# Patient Record
Sex: Male | Born: 1975 | Race: Black or African American | Hispanic: No | Marital: Single | Smoking: Current every day smoker
Health system: Southern US, Community
[De-identification: ages and names within clinical notes are randomized; demographics above are authoritative.]

---

## 2009-12-31 ENCOUNTER — Emergency Department (HOSPITAL_COMMUNITY): Admission: EM | Admit: 2009-12-31 | Discharge: 2009-12-31 | Payer: Self-pay | Admitting: Emergency Medicine

## 2010-02-28 ENCOUNTER — Emergency Department (HOSPITAL_COMMUNITY): Admission: EM | Admit: 2010-02-28 | Discharge: 2010-02-28 | Payer: Self-pay | Admitting: Emergency Medicine

## 2010-05-05 ENCOUNTER — Emergency Department (HOSPITAL_COMMUNITY): Admission: EM | Admit: 2010-05-05 | Discharge: 2010-05-05 | Payer: Self-pay | Admitting: Emergency Medicine

## 2011-01-24 LAB — DIFFERENTIAL
Eosinophils Absolute: 0.2 10*3/uL (ref 0.0–0.7)
Eosinophils Relative: 3 % (ref 0–5)
Lymphs Abs: 1.2 10*3/uL (ref 0.7–4.0)
Monocytes Relative: 12 % (ref 3–12)
Neutro Abs: 5 10*3/uL (ref 1.7–7.7)

## 2011-01-24 LAB — URINALYSIS, ROUTINE W REFLEX MICROSCOPIC
Bilirubin Urine: NEGATIVE
Hgb urine dipstick: NEGATIVE
Protein, ur: NEGATIVE mg/dL
Specific Gravity, Urine: 1.028 (ref 1.005–1.030)
Urobilinogen, UA: 1 mg/dL (ref 0.0–1.0)
pH: 6.5 (ref 5.0–8.0)

## 2011-01-24 LAB — CBC
MCHC: 32.8 g/dL (ref 30.0–36.0)
MCV: 83.4 fL (ref 78.0–100.0)
Platelets: 233 10*3/uL (ref 150–400)
RDW: 14.3 % (ref 11.5–15.5)

## 2011-02-13 ENCOUNTER — Emergency Department (HOSPITAL_COMMUNITY)
Admission: EM | Admit: 2011-02-13 | Discharge: 2011-02-13 | Disposition: A | Discharge status: Home / Self Care | Payer: Self-pay | Attending: Emergency Medicine | Admitting: Emergency Medicine

## 2011-02-13 DIAGNOSIS — J45909 Unspecified asthma, uncomplicated: Secondary | ICD-10-CM | POA: Insufficient documentation

## 2011-02-13 DIAGNOSIS — R0602 Shortness of breath: Secondary | ICD-10-CM | POA: Insufficient documentation

## 2011-03-03 ENCOUNTER — Emergency Department (HOSPITAL_COMMUNITY)
Admission: EM | Admit: 2011-03-03 | Discharge: 2011-03-03 | Disposition: A | Discharge status: Home / Self Care | Payer: Self-pay | Attending: Emergency Medicine | Admitting: Emergency Medicine

## 2011-03-03 ENCOUNTER — Emergency Department (HOSPITAL_COMMUNITY): Payer: Self-pay

## 2011-03-03 DIAGNOSIS — R059 Cough, unspecified: Secondary | ICD-10-CM | POA: Insufficient documentation

## 2011-03-03 DIAGNOSIS — R05 Cough: Secondary | ICD-10-CM | POA: Insufficient documentation

## 2011-03-03 DIAGNOSIS — R0789 Other chest pain: Secondary | ICD-10-CM | POA: Insufficient documentation

## 2011-03-03 DIAGNOSIS — R0602 Shortness of breath: Secondary | ICD-10-CM | POA: Insufficient documentation

## 2011-03-03 DIAGNOSIS — J45909 Unspecified asthma, uncomplicated: Secondary | ICD-10-CM | POA: Insufficient documentation

## 2011-03-19 ENCOUNTER — Emergency Department (HOSPITAL_COMMUNITY)
Admission: EM | Admit: 2011-03-19 | Discharge: 2011-03-19 | Disposition: A | Discharge status: Home / Self Care | Payer: Self-pay | Attending: Emergency Medicine | Admitting: Emergency Medicine

## 2011-03-19 DIAGNOSIS — J45901 Unspecified asthma with (acute) exacerbation: Secondary | ICD-10-CM | POA: Insufficient documentation

## 2011-03-26 ENCOUNTER — Emergency Department (HOSPITAL_COMMUNITY)
Admission: EM | Admit: 2011-03-26 | Discharge: 2011-03-26 | Disposition: A | Discharge status: Home / Self Care | Payer: Self-pay | Attending: Emergency Medicine | Admitting: Emergency Medicine

## 2011-03-26 DIAGNOSIS — J45909 Unspecified asthma, uncomplicated: Secondary | ICD-10-CM | POA: Insufficient documentation

## 2011-03-26 DIAGNOSIS — Z79899 Other long term (current) drug therapy: Secondary | ICD-10-CM | POA: Insufficient documentation

## 2011-04-01 ENCOUNTER — Emergency Department (HOSPITAL_COMMUNITY)
Admission: EM | Admit: 2011-04-01 | Discharge: 2011-04-01 | Disposition: A | Discharge status: Home / Self Care | Payer: Self-pay | Attending: Emergency Medicine | Admitting: Emergency Medicine

## 2011-04-01 ENCOUNTER — Emergency Department (HOSPITAL_COMMUNITY): Payer: Self-pay

## 2011-04-01 DIAGNOSIS — J45909 Unspecified asthma, uncomplicated: Secondary | ICD-10-CM | POA: Insufficient documentation

## 2011-04-01 DIAGNOSIS — R0602 Shortness of breath: Secondary | ICD-10-CM | POA: Insufficient documentation

## 2011-04-06 ENCOUNTER — Emergency Department (HOSPITAL_COMMUNITY)
Admission: EM | Admit: 2011-04-06 | Discharge: 2011-04-06 | Disposition: A | Discharge status: Home / Self Care | Payer: Self-pay | Attending: Emergency Medicine | Admitting: Emergency Medicine

## 2011-04-06 ENCOUNTER — Emergency Department (HOSPITAL_COMMUNITY): Payer: Self-pay

## 2011-04-06 DIAGNOSIS — J309 Allergic rhinitis, unspecified: Secondary | ICD-10-CM | POA: Insufficient documentation

## 2011-04-06 DIAGNOSIS — J45909 Unspecified asthma, uncomplicated: Secondary | ICD-10-CM | POA: Insufficient documentation

## 2011-04-06 DIAGNOSIS — R0602 Shortness of breath: Secondary | ICD-10-CM | POA: Insufficient documentation

## 2011-04-22 ENCOUNTER — Emergency Department (HOSPITAL_COMMUNITY)
Admission: EM | Admit: 2011-04-22 | Discharge: 2011-04-22 | Disposition: A | Discharge status: Home / Self Care | Payer: Self-pay | Attending: Emergency Medicine | Admitting: Emergency Medicine

## 2011-04-22 DIAGNOSIS — J45909 Unspecified asthma, uncomplicated: Secondary | ICD-10-CM | POA: Insufficient documentation

## 2011-04-22 DIAGNOSIS — R0602 Shortness of breath: Secondary | ICD-10-CM | POA: Insufficient documentation

## 2011-04-22 DIAGNOSIS — R059 Cough, unspecified: Secondary | ICD-10-CM | POA: Insufficient documentation

## 2011-04-22 DIAGNOSIS — R05 Cough: Secondary | ICD-10-CM | POA: Insufficient documentation

## 2011-04-22 DIAGNOSIS — R000 Tachycardia, unspecified: Secondary | ICD-10-CM | POA: Insufficient documentation

## 2011-04-27 ENCOUNTER — Emergency Department (HOSPITAL_COMMUNITY)
Admission: EM | Admit: 2011-04-27 | Discharge: 2011-04-27 | Disposition: A | Discharge status: Home / Self Care | Payer: Self-pay | Attending: Emergency Medicine | Admitting: Emergency Medicine

## 2011-04-27 ENCOUNTER — Emergency Department (HOSPITAL_COMMUNITY): Payer: Self-pay

## 2011-04-27 DIAGNOSIS — R0602 Shortness of breath: Secondary | ICD-10-CM | POA: Insufficient documentation

## 2011-04-27 DIAGNOSIS — R079 Chest pain, unspecified: Secondary | ICD-10-CM | POA: Insufficient documentation

## 2011-04-27 DIAGNOSIS — R059 Cough, unspecified: Secondary | ICD-10-CM | POA: Insufficient documentation

## 2011-04-27 DIAGNOSIS — R05 Cough: Secondary | ICD-10-CM | POA: Insufficient documentation

## 2011-04-27 DIAGNOSIS — J45901 Unspecified asthma with (acute) exacerbation: Secondary | ICD-10-CM | POA: Insufficient documentation

## 2011-05-02 ENCOUNTER — Emergency Department (HOSPITAL_COMMUNITY)
Admission: EM | Admit: 2011-05-02 | Discharge: 2011-05-02 | Disposition: A | Discharge status: Home / Self Care | Payer: Self-pay | Attending: Emergency Medicine | Admitting: Emergency Medicine

## 2011-05-02 DIAGNOSIS — J45909 Unspecified asthma, uncomplicated: Secondary | ICD-10-CM | POA: Insufficient documentation

## 2011-05-02 DIAGNOSIS — R0789 Other chest pain: Secondary | ICD-10-CM | POA: Insufficient documentation

## 2011-05-02 DIAGNOSIS — R0682 Tachypnea, not elsewhere classified: Secondary | ICD-10-CM | POA: Insufficient documentation

## 2011-05-24 ENCOUNTER — Emergency Department (HOSPITAL_COMMUNITY)
Admission: EM | Admit: 2011-05-24 | Discharge: 2011-05-24 | Disposition: A | Discharge status: Home / Self Care | Payer: Self-pay | Attending: Emergency Medicine | Admitting: Emergency Medicine

## 2011-05-24 DIAGNOSIS — R0682 Tachypnea, not elsewhere classified: Secondary | ICD-10-CM | POA: Insufficient documentation

## 2011-05-24 DIAGNOSIS — J45909 Unspecified asthma, uncomplicated: Secondary | ICD-10-CM | POA: Insufficient documentation

## 2011-07-07 IMAGING — CR DG CHEST 2V
2 series · 2 of 2 positions shown · non-contrast
Comparison: Chest x-ray of 02/28/2010

CLINICAL DATA: Shortness of breath, cough, chest pain, history of
asthma

CHEST - 2 VIEW

[w chest pa]
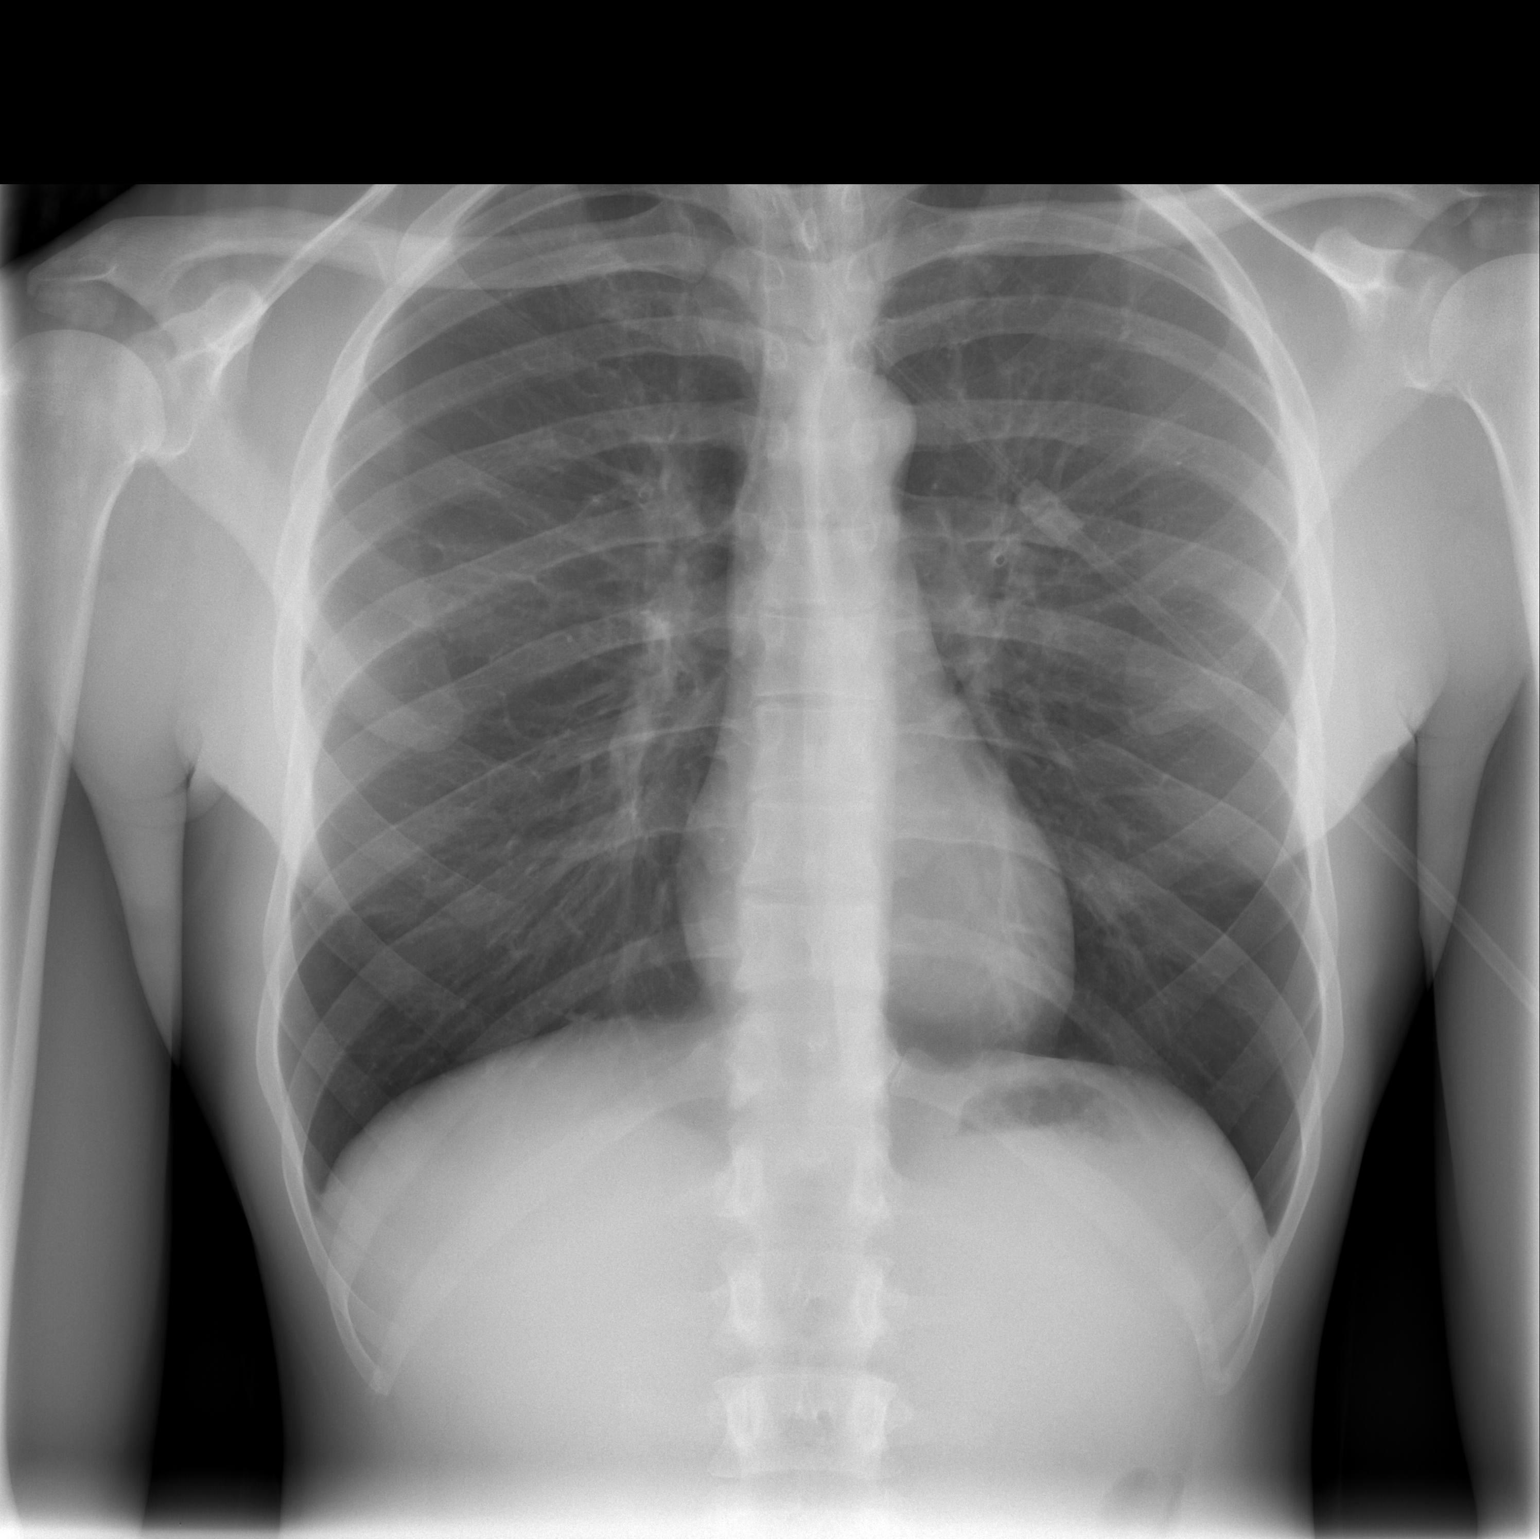

[w chest lat]
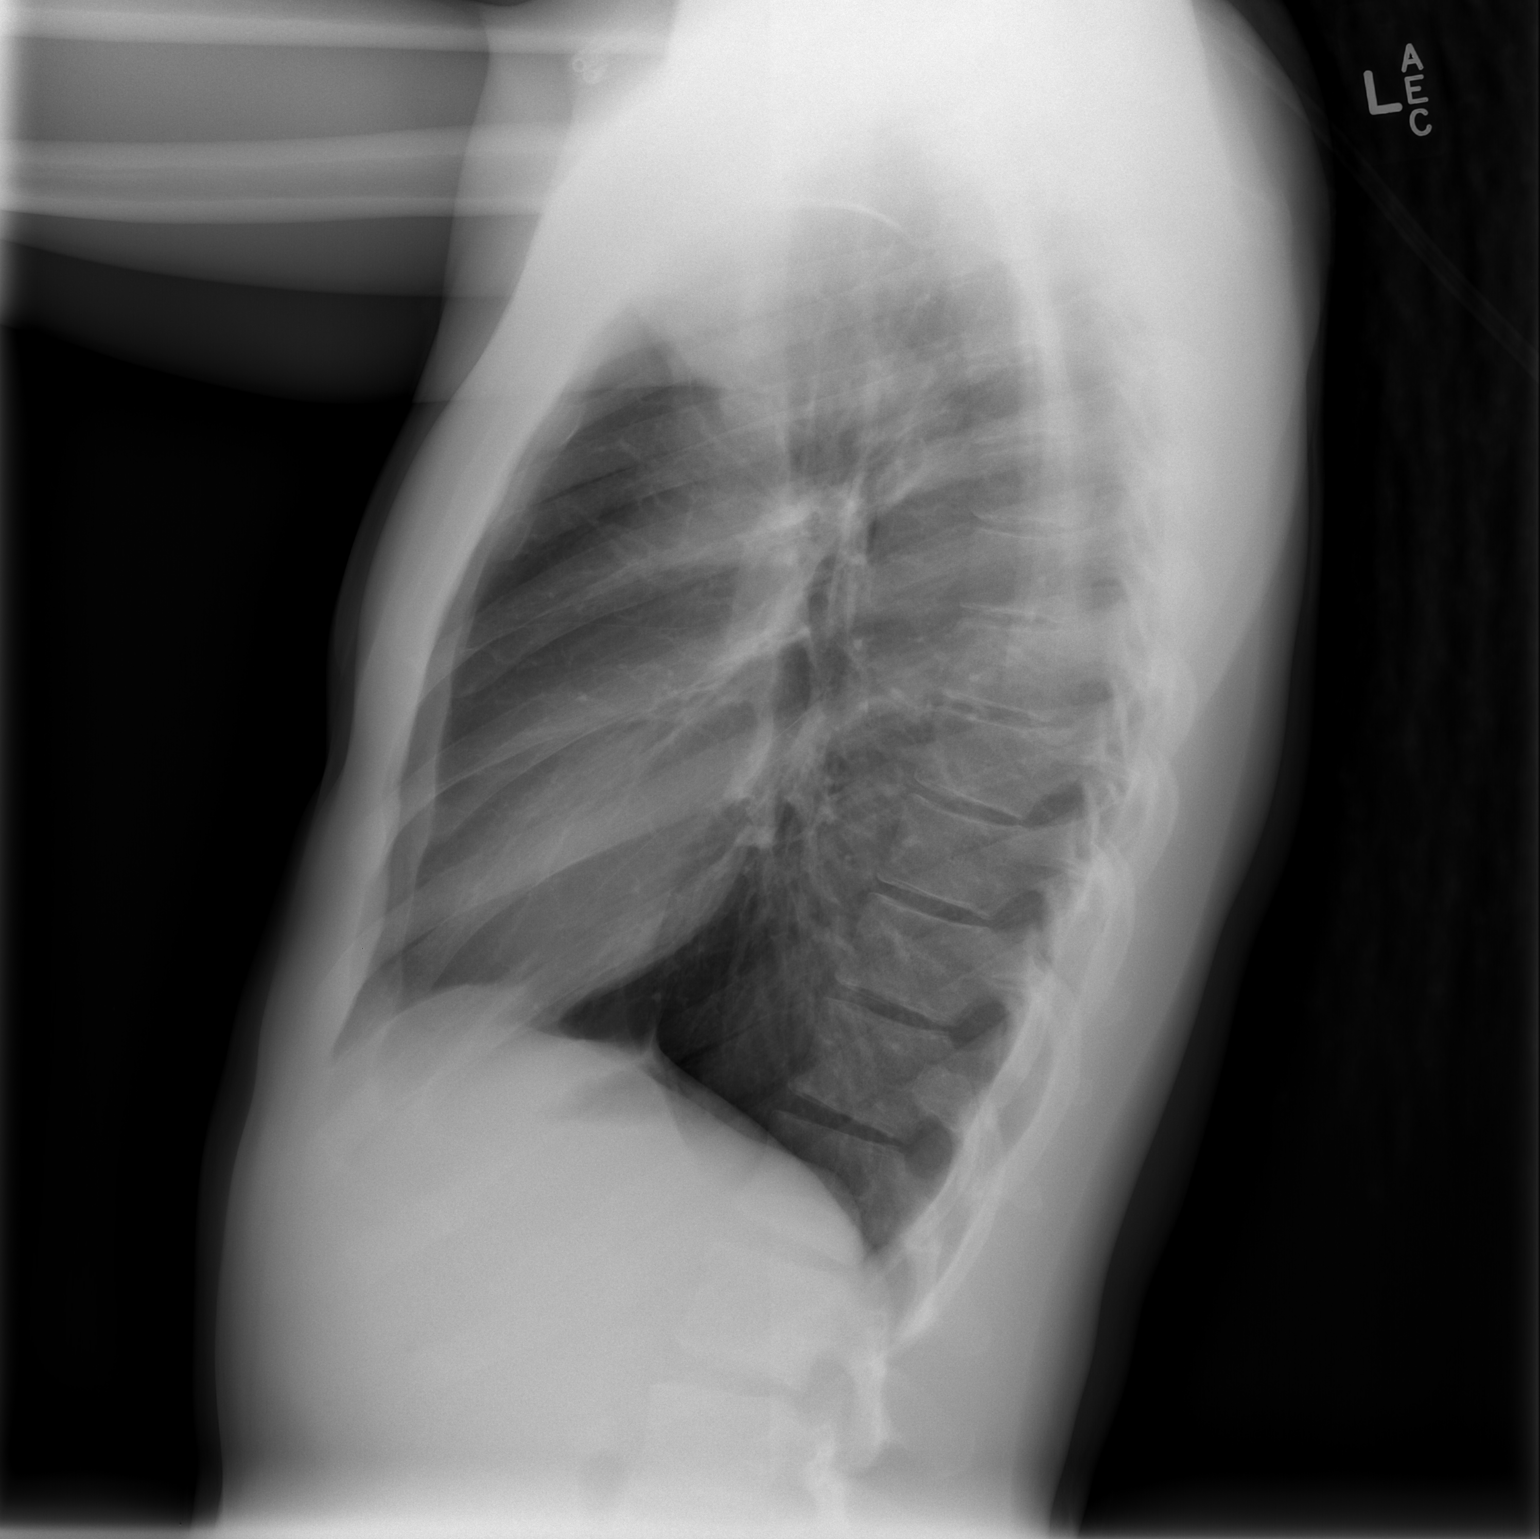

[2 of 2 positions shown; findings below may reference images not displayed]

FINDINGS: No pneumonia is seen.  The lungs remain hyperaerated and
there is little change in peribronchial thickening.  Mediastinal
contours are stable.  The heart is within normal limits in size.
No bony abnormality is seen.
IMPRESSION: No pneumonia.  No change in hyperaeration and peribronchial
thickening.

## 2011-09-05 ENCOUNTER — Inpatient Hospital Stay (INDEPENDENT_AMBULATORY_CARE_PROVIDER_SITE_OTHER)
Admission: RE | Admit: 2011-09-05 | Discharge: 2011-09-05 | Disposition: A | Discharge status: Home / Self Care | Payer: Self-pay | Source: Ambulatory Visit | Attending: Family Medicine | Admitting: Family Medicine

## 2011-09-05 DIAGNOSIS — A64 Unspecified sexually transmitted disease: Secondary | ICD-10-CM

## 2011-09-06 LAB — GC/CHLAMYDIA PROBE AMP, GENITAL
Chlamydia, DNA Probe: NEGATIVE
GC Probe Amp, Genital: NEGATIVE

## 2012-01-31 ENCOUNTER — Encounter (HOSPITAL_COMMUNITY): Payer: Self-pay | Admitting: Emergency Medicine

## 2012-01-31 ENCOUNTER — Emergency Department (HOSPITAL_COMMUNITY)
Admission: EM | Admit: 2012-01-31 | Discharge: 2012-01-31 | Disposition: A | Discharge status: Home / Self Care | Payer: Self-pay | Attending: Emergency Medicine | Admitting: Emergency Medicine

## 2012-01-31 DIAGNOSIS — L02519 Cutaneous abscess of unspecified hand: Secondary | ICD-10-CM | POA: Insufficient documentation

## 2012-01-31 DIAGNOSIS — L03119 Cellulitis of unspecified part of limb: Secondary | ICD-10-CM | POA: Insufficient documentation

## 2012-01-31 DIAGNOSIS — J45909 Unspecified asthma, uncomplicated: Secondary | ICD-10-CM | POA: Insufficient documentation

## 2012-01-31 DIAGNOSIS — L03114 Cellulitis of left upper limb: Secondary | ICD-10-CM

## 2012-01-31 MED ORDER — HYDROCODONE-ACETAMINOPHEN 5-325 MG PO TABS: 1.0000 | ORAL_TABLET | ORAL | Status: AC | PRN

## 2012-01-31 MED ORDER — HYDROCODONE-ACETAMINOPHEN 5-325 MG PO TABS
1.0000 | ORAL_TABLET | Freq: Once | ORAL | Status: AC
  Administered 2012-01-31: 1 via ORAL
  Filled 2012-01-31: qty 1

## 2012-01-31 MED ORDER — SULFAMETHOXAZOLE-TRIMETHOPRIM 800-160 MG PO TABS: 2.0000 | ORAL_TABLET | Freq: Two times a day (BID) | ORAL | Status: AC

## 2012-01-31 MED ORDER — SULFAMETHOXAZOLE-TMP DS 800-160 MG PO TABS
1.0000 | ORAL_TABLET | Freq: Once | ORAL | Status: AC
  Administered 2012-01-31: 1 via ORAL
  Filled 2012-01-31: qty 1

## 2012-01-31 MED ORDER — CEPHALEXIN 500 MG PO CAPS
500.0000 mg | ORAL_CAPSULE | Freq: Once | ORAL | Status: AC
  Administered 2012-01-31: 500 mg via ORAL
  Filled 2012-01-31: qty 1

## 2012-01-31 MED ORDER — CEPHALEXIN 500 MG PO CAPS: 500.0000 mg | ORAL_CAPSULE | Freq: Four times a day (QID) | ORAL | Status: AC

## 2012-01-31 NOTE — Discharge Instructions (Signed)
Take antibiotic as prescribed.  Take vicodin as prescribed for severe pain.   Do not drive within four hours of taking this medication (may cause drowsiness or confusion).  Please return to the ER on Sunday for recheck.  You should return sooner if the redness spreads or you develop temperature greater than 100.5.

## 2012-01-31 NOTE — ED Notes (Signed)
Pt presented top the ER with c/o left digit swelling, states noted this am, area red and painful, 9/10 at this time, also noted redness going up the arm.

## 2012-01-31 NOTE — ED Provider Notes (Signed)
History     CSN: [id number]  Arrival date & time 01/31/12  2025   First MD Initiated Contact with Patient 01/31/12 2205      Chief Complaint  Patient presents with  . left hand digit swelling   . possible insect bite     (Consider location/radiation/quality/duration/timing/severity/associated sxs/prior treatment) HPI History provided by pt.   Pt noticed what appeared to be an insect bite on the medial surface of left 4th proximal phalanx yesterday.  No drainage.  Has developed pain and spreading erythema since then.  The erythema tracks up his forearm to his elbow.  No associated fever.  Pt has no PMH.    Past Medical History  Diagnosis Date  . Asthma     History reviewed. No pertinent past surgical history.  History reviewed. No pertinent family history.  History  Substance Use Topics  . Smoking status: Never Smoker   . Smokeless tobacco: Not on file  . Alcohol Use: Yes      Review of Systems  All other systems reviewed and are negative.    Allergies  Review of patient's allergies indicates no known allergies.  Home Medications   Current Outpatient Rx  Name Route Sig Dispense Refill  . ALBUTEROL SULFATE HFA 108 (90 BASE) MCG/ACT IN AERS Inhalation Inhale 2 puffs into the lungs every 6 (six) hours as needed.    . CEPHALEXIN 500 MG PO CAPS Oral Take 1 capsule (500 mg total) by mouth 4 (four) times daily. 28 capsule 0  . HYDROCODONE-ACETAMINOPHEN 5-325 MG PO TABS Oral Take 1 tablet by mouth every 4 (four) hours as needed for pain. 20 tablet 0  . SULFAMETHOXAZOLE-TRIMETHOPRIM 800-160 MG PO TABS Oral Take 2 tablets by mouth 2 (two) times daily. 28 tablet 0    BP 128/74  Pulse 71  Temp(Src) 98.5 F (36.9 C) (Oral)  Resp 22  SpO2 99%  Physical Exam  Nursing note and vitals reviewed. Constitutional: He is oriented to person, place, and time. He appears well-developed and well-nourished. No distress.  HENT:  Head: Normocephalic and atraumatic.  Eyes:   Normal appearance  Neck: Normal range of motion.  Neurological: He is alert and oriented to person, place, and time.  Skin:       5mm papular lesion on medial surface of left 4th proximal phalanx.  Surrounding erythema covers anterior and medial proximal phalanx, spreads into the dorsal surface of hand covering an area approx 4cm in diameter and then a narrow track up the lateral forearm that wraps around to the antecubital fossa.  No edema.  No crepitus.  Mild ttp.  Full ROM of all joints w/out pain.    Psychiatric: He has a normal mood and affect. His behavior is normal.    ED Course  Procedures (including critical care time)  Labs Reviewed - No data to display No results found.   1. Cellulitis of left hand       MDM  35yo healthy M presents w/ cellulitis/lymphangitis LUE.  He is immunocompetent, afebrile and well-appearing.  D/c'd home w/ bactrim, keflex and vicodin and advised him to return in 48hrs for recheck.  I have drawn the margins of erythema on his hand/finger and marked the end of track at antecubital fossa.          Otilio Miu, [place] 02/01/12 1031

## 2012-02-01 NOTE — ED Provider Notes (Signed)
Medical screening examination/treatment/procedure(s) were performed by non-physician practitioner and as supervising physician I was immediately available for consultation/collaboration.  Mariesa Grieder, MD 02/01/12 1907 

## 2012-02-02 ENCOUNTER — Encounter (HOSPITAL_COMMUNITY): Payer: Self-pay | Admitting: *Deleted

## 2012-02-02 ENCOUNTER — Emergency Department (HOSPITAL_COMMUNITY)
Admission: EM | Admit: 2012-02-02 | Discharge: 2012-02-02 | Disposition: A | Discharge status: Home / Self Care | Payer: Self-pay | Attending: Emergency Medicine | Admitting: Emergency Medicine

## 2012-02-02 DIAGNOSIS — L0390 Cellulitis, unspecified: Secondary | ICD-10-CM

## 2012-02-02 DIAGNOSIS — L02519 Cutaneous abscess of unspecified hand: Secondary | ICD-10-CM | POA: Insufficient documentation

## 2012-02-02 DIAGNOSIS — J45909 Unspecified asthma, uncomplicated: Secondary | ICD-10-CM | POA: Insufficient documentation

## 2012-02-02 DIAGNOSIS — L03019 Cellulitis of unspecified finger: Secondary | ICD-10-CM | POA: Insufficient documentation

## 2012-02-02 DIAGNOSIS — IMO0002 Reserved for concepts with insufficient information to code with codable children: Secondary | ICD-10-CM | POA: Insufficient documentation

## 2012-02-02 MED ORDER — SULFAMETHOXAZOLE-TRIMETHOPRIM 800-160 MG PO TABS: 1.0000 | ORAL_TABLET | Freq: Two times a day (BID) | ORAL | Status: AC

## 2012-02-02 MED ORDER — SULFAMETHOXAZOLE-TMP DS 800-160 MG PO TABS
1.0000 | ORAL_TABLET | Freq: Once | ORAL | Status: AC
  Administered 2012-02-02: 1 via ORAL
  Filled 2012-02-02: qty 1

## 2012-02-02 NOTE — ED Provider Notes (Signed)
History     CSN: [id number]  Arrival date & time 02/02/12  1319   First MD Initiated Contact with Patient 02/02/12 1335      Chief Complaint  Patient presents with  . Cellulitis    (Consider location/radiation/quality/duration/timing/severity/associated sxs/prior treatment) Patient is a 36 y.o. male presenting with abscess. The history is provided by the patient. No language interpreter was used.  Abscess  This is a new problem. The abscess is present on the left arm and left hand. The abscess is characterized by redness. Pertinent negatives include no fever and no vomiting.    Past Medical History  Diagnosis Date  . Asthma     History reviewed. No pertinent past surgical history.  No family history on file.  History  Substance Use Topics  . Smoking status: Never Smoker   . Smokeless tobacco: Not on file  . Alcohol Use: Yes      Review of Systems  Unable to perform ROS Constitutional: Negative.  Negative for fever.  HENT: Negative.   Respiratory: Negative.   Cardiovascular: Negative.   Gastrointestinal: Negative.  Negative for vomiting.  Skin:       Papula to th 4th finger on the L hand.  Cellulitis gone to the L forearm and antecubital.   Neurological: Negative.   Psychiatric/Behavioral: Negative.   All other systems reviewed and are negative.    Allergies  Review of patient's allergies indicates no known allergies.  Home Medications   Current Outpatient Rx  Name Route Sig Dispense Refill  . ALBUTEROL SULFATE HFA 108 (90 BASE) MCG/ACT IN AERS Inhalation Inhale 2 puffs into the lungs every 6 (six) hours as needed.    . CEPHALEXIN 500 MG PO CAPS Oral Take 1 capsule (500 mg total) by mouth 4 (four) times daily. 28 capsule 0  . HYDROCODONE-ACETAMINOPHEN 5-325 MG PO TABS Oral Take 1 tablet by mouth every 4 (four) hours as needed for pain. 20 tablet 0  . SULFAMETHOXAZOLE-TRIMETHOPRIM 800-160 MG PO TABS Oral Take 2 tablets by mouth 2 (two) times daily. 28  tablet 0  . SULFAMETHOXAZOLE-TRIMETHOPRIM 800-160 MG PO TABS Oral Take 1 tablet by mouth 2 (two) times daily. 6 tablet 0    BP 119/70  Pulse 91  Temp(Src) 98 F (36.7 C) (Oral)  Resp 16  SpO2 99%  Physical Exam  Nursing note and vitals reviewed. Constitutional: He is oriented to person, place, and time. He appears well-developed and well-nourished.  HENT:  Head: Normocephalic.  Eyes: Conjunctivae and EOM are normal. Pupils are equal, round, and reactive to light.  Neck: Normal range of motion. Neck supple.  Cardiovascular: Normal rate.   Pulmonary/Chest: Effort normal.  Abdominal: Soft.  Musculoskeletal: Normal range of motion.  Neurological: He is alert and oriented to person, place, and time.  Skin: Skin is warm and dry.       Small area of erythema to the 4th finger  Psychiatric: He has a normal mood and affect.    ED Course  Procedures (including critical care time)  Labs Reviewed - No data to display No results found.   No diagnosis found.    MDM  Spoke with case manager and he will get assistance with paying for antibiotics today.  Did not get any rx filled 2 days ago for his infection. Although the infection is better I will put on bactrim for 3 days. Ready for discharge  Return if worse.        Jethro Bastos, NP 02/02/12 [phone]

## 2012-02-02 NOTE — Discharge Instructions (Signed)
Complete the bactum ds antibiotics.  Return if worse.  Get a PCP from the list below.     RESOURCE GUIDE  Dental Problems  Patients with Medicaid: [facility] [phone]                                           [phone] [address] [employer] [address] Phone:  [phone]                                                  Phone:  [phone]  If unable to pay or uninsured, contact:  [facility] or [facility]. to become qualified for the adult dental clinic.  Chronic Pain Problems Contact [facility]  [phone] Patients need to be referred by their primary care doctor.  Insufficient Money for Medicine Contact [employer]:  call "211" or [facility] [phone].  No Primary Care Doctor Call [facility]  [phone] Other agencies that provide inexpensive medical care    [facility]  [phone]    [facility]  [phone]    [facility]  [phone]    [facility]  [phone]    [facility]  [phone]    [facility]  [phone]  Psychological Services [facility]  [phone] [facility] Services  [phone] [facility]   [phone] (emergency services [phone])  Substance Abuse Resources [facility]  218-714-6362 [facility] [phone] [facility] [phone] Floydene Flock [phone] Residential & Outpatient Substance Abuse Program  [phone]  Abuse/Neglect [facility] Child Abuse Hotline 949-622-0149 [facility] Child Abuse Hotline [phone] (After Hours)  Emergency Shelter [facility] [phone]  Maternity Homes [facility] [place] [facility] 6714046691 [facility] Alert Services [phone]  MRSA Hotline #:   563 827 3646    [facility] Resources  [facility]     [employer]                           [facility] Dept. [address] [place]                       [address]      [address]  [place]                                                Cristobal Goldmann Phone:  [phone]                                   Phone:  [phone]  Phone:  [phone]  [facility] Mental [facility] Phone:  [phone]  [facility] Child Abuse Hotline [phone] [phone] (After Hours)

## 2012-02-02 NOTE — ED Notes (Signed)
Patient returns for would recheck. Lesion noted 01/30/12 to left ring finger. Seen here 01/31/12 RX ATB. Patient was unable to obtain RX due to finances. Cellulitis with significant improvement. Pain decreased.

## 2012-02-04 NOTE — ED Provider Notes (Signed)
Medical screening examination/treatment/procedure(s) were performed by non-physician practitioner and as supervising physician I was immediately available for consultation/collaboration.  Huie Ghuman T Kayslee Furey, MD 02/04/12 0812 

## 2012-02-27 ENCOUNTER — Emergency Department (HOSPITAL_COMMUNITY)
Admission: EM | Admit: 2012-02-27 | Discharge: 2012-02-27 | Disposition: A | Discharge status: Home / Self Care | Payer: Self-pay | Attending: Emergency Medicine | Admitting: Emergency Medicine

## 2012-02-27 DIAGNOSIS — J45901 Unspecified asthma with (acute) exacerbation: Secondary | ICD-10-CM | POA: Insufficient documentation

## 2012-02-27 DIAGNOSIS — R0602 Shortness of breath: Secondary | ICD-10-CM | POA: Insufficient documentation

## 2012-02-27 MED ORDER — IPRATROPIUM BROMIDE 0.02 % IN SOLN
0.5000 mg | Freq: Once | RESPIRATORY_TRACT | Status: AC
  Administered 2012-02-27: 0.5 mg via RESPIRATORY_TRACT
  Filled 2012-02-27: qty 2.5

## 2012-02-27 MED ORDER — ALBUTEROL SULFATE (5 MG/ML) 0.5% IN NEBU
5.0000 mg | INHALATION_SOLUTION | Freq: Once | RESPIRATORY_TRACT | Status: AC
  Administered 2012-02-27: 5 mg via RESPIRATORY_TRACT
  Filled 2012-02-27: qty 1

## 2012-02-27 MED ORDER — ALBUTEROL SULFATE HFA 108 (90 BASE) MCG/ACT IN AERS
2.0000 | INHALATION_SPRAY | Freq: Once | RESPIRATORY_TRACT | Status: AC
  Administered 2012-02-27: 2 via RESPIRATORY_TRACT
  Filled 2012-02-27: qty 6.7

## 2012-02-27 MED ORDER — PREDNISONE 20 MG PO TABS: 40.0000 mg | ORAL_TABLET | Freq: Every day | ORAL | Status: AC

## 2012-02-27 MED ORDER — ALBUTEROL SULFATE HFA 108 (90 BASE) MCG/ACT IN AERS: 2.0000 | INHALATION_SPRAY | RESPIRATORY_TRACT | Status: AC | PRN

## 2012-02-27 MED ORDER — PREDNISONE 20 MG PO TABS
40.0000 mg | ORAL_TABLET | Freq: Once | ORAL | Status: AC
  Administered 2012-02-27: 40 mg via ORAL
  Filled 2012-02-27: qty 2

## 2012-02-27 NOTE — ED Provider Notes (Signed)
History    35ym with asthma exacerbation. Onset this morning. Persistent since. Felt fine last night. Out of albuterol inhaler. Mild chest tightness. Occasional nonproductive cough. No fever or chills. No unusual leg pain or swelling. No facial swelling.   CSN: [id number]  Arrival date & time 02/27/12  0701   First MD Initiated Contact with Patient 02/27/12 [phone]      Chief Complaint  Patient presents with  . Shortness of Breath    (Consider location/radiation/quality/duration/timing/severity/associated sxs/prior treatment) HPI  Past Medical History  Diagnosis Date  . Asthma     No past surgical history on file.  No family history on file.  History  Substance Use Topics  . Smoking status: Never Smoker   . Smokeless tobacco: Not on file  . Alcohol Use: Yes      Review of Systems   Review of symptoms negative unless otherwise noted in HPI.   Allergies  Review of patient's allergies indicates no known allergies.  Home Medications   Current Outpatient Rx  Name Route Sig Dispense Refill  . ALBUTEROL SULFATE HFA 108 (90 BASE) MCG/ACT IN AERS Inhalation Inhale 2 puffs into the lungs every 6 (six) hours as needed.      There were no vitals taken for this visit.  Physical Exam  Nursing note and vitals reviewed. Constitutional: He appears well-developed and well-nourished. No distress.  HENT:  Head: Normocephalic and atraumatic.  Eyes: Conjunctivae are normal. Right eye exhibits no discharge. Left eye exhibits no discharge.  Neck: Neck supple.  Cardiovascular: Normal rate, regular rhythm and normal heart sounds.  Exam reveals no gallop and no friction rub.   No murmur heard. Pulmonary/Chest: Effort normal. No respiratory distress. He has wheezes.       Speaking in full sentences. No increased WOB. Diffuse wheezing b/l.   Musculoskeletal: He exhibits no edema and no tenderness.       Lower extremities symmetric as compared to each other. No calf tenderness.  Negative Homan's. No palpable cords.   Neurological: He is alert.  Skin: Skin is warm and dry.  Psychiatric: He has a normal mood and affect. His behavior is normal. Thought content normal.    ED Course  Procedures (including critical care time)  Labs Reviewed - No data to display No results found.   1. Asthma exacerbation       MDM  35yM with asthma exacerbation. No respiratory distress. Better with nebs. O2 sats 99-100% on RA. Afebrile. Doubt pneumonia. Doubt DVT. Pt given albuterol inhaler in ED and prescription for more and course of steroids.        Raeford Razor, MD 02/27/12 [phone]

## 2012-02-27 NOTE — ED Notes (Signed)
PT arrived via [facility] c/o SOB due asthma exacerbation. Pt is out of his inhaler. Breathing treatment (5 mg albuterol) administered prior to arrival.

## 2012-02-27 NOTE — Discharge Instructions (Signed)
Asthma Attack Prevention HOW CAN ASTHMA BE PREVENTED? Currently, there is no way to prevent asthma from starting. However, you can take steps to control the disease and prevent its symptoms after you have been diagnosed. Learn about your asthma and how to control it. Take an active role to control your asthma by working with your caregiver to create and follow an asthma action plan. An asthma action plan guides you in taking your medicines properly, avoiding factors that make your asthma worse, tracking your level of asthma control, responding to worsening asthma, and seeking emergency care when needed. To track your asthma, keep records of your symptoms, check your peak flow number using a peak flow meter (handheld device that shows how well air moves out of your lungs), and get regular asthma checkups.  Other ways to prevent asthma attacks include:  Use medicines as your caregiver directs.   Identify and avoid things that make your asthma worse (as much as you can).   Keep track of your asthma symptoms and level of control.   Get regular checkups for your asthma.   With your caregiver, write a detailed plan for taking medicines and managing an asthma attack. Then be sure to follow your action plan. Asthma is an ongoing condition that needs regular monitoring and treatment.   Identify and avoid asthma triggers. A number of outdoor allergens and irritants (pollen, mold, cold air, air pollution) can trigger asthma attacks. Find out what causes or makes your asthma worse, and take steps to avoid those triggers (see below).   Monitor your breathing. Learn to recognize warning signs of an attack, such as slight coughing, wheezing or shortness of breath. However, your lung function may already decrease before you notice any signs or symptoms, so regularly measure and record your peak airflow with a home peak flow meter.   Identify and treat attacks early. If you act quickly, you're less likely to have  a severe attack. You will also need less medicine to control your symptoms. When your peak flow measurements decrease and alert you to an upcoming attack, take your medicine as instructed, and immediately stop any activity that may have triggered the attack. If your symptoms do not improve, get medical help.   Pay attention to increasing quick-relief inhaler use. If you find yourself relying on your quick-relief inhaler (such as albuterol), your asthma is not under control. See your caregiver about adjusting your treatment.  IDENTIFY AND CONTROL FACTORS THAT MAKE YOUR ASTHMA WORSE A number of common things can set off or make your asthma symptoms worse (asthma triggers). Keep track of your asthma symptoms for several weeks, detailing all the environmental and emotional factors that are linked with your asthma. When you have an asthma attack, go back to your asthma diary to see which factor, or combination of factors, might have contributed to it. Once you know what these factors are, you can take steps to control many of them.  Allergies: If you have allergies and asthma, it is important to take asthma prevention steps at home. Asthma attacks (worsening of asthma symptoms) can be triggered by allergies, which can cause temporary increased inflammation of your airways. Minimizing contact with the substance to which you are allergic will help prevent an asthma attack. Animal Dander:   Some people are allergic to the flakes of skin or dried saliva from animals with fur or feathers. Keep these pets out of your home.   If you can't keep a pet outdoors, keep the   pet out of your bedroom and other sleeping areas at all times, and keep the door closed.   Remove carpets and furniture covered with cloth from your home. If that is not possible, keep the pet away from fabric-covered furniture and carpets.  Dust Mites:  Many people with asthma are allergic to dust mites. Dust mites are tiny bugs that are found in  every home, in mattresses, pillows, carpets, fabric-covered furniture, bedcovers, clothes, stuffed toys, fabric, and other fabric-covered items.   Cover your mattress in a special dust-proof cover.   Cover your pillow in a special dust-proof cover, or wash the pillow each week in hot water. Water must be hotter than 130 F to kill dust mites. Cold or warm water used with detergent and bleach can also be effective.   Wash the sheets and blankets on your bed each week in hot water.   Try not to sleep or lie on cloth-covered cushions.   Call ahead when traveling and ask for a smoke-free hotel room. Bring your own bedding and pillows, in case the hotel only supplies feather pillows and down comforters, which may contain dust mites and cause asthma symptoms.   Remove carpets from your bedroom and those laid on concrete, if you can.   Keep stuffed toys out of the bed, or wash the toys weekly in hot water or cooler water with detergent and bleach.  Cockroaches:  Many people with asthma are allergic to the droppings and remains of cockroaches.   Keep food and garbage in closed containers. Never leave food out.   Use poison baits, traps, powders, gels, or paste (for example, boric acid).   If a spray is used to kill cockroaches, stay out of the room until the odor goes away.  Indoor Mold:  Fix leaky faucets, pipes, or other sources of water that have mold around them.   Clean moldy surfaces with a cleaner that has bleach in it.  Pollen and Outdoor Mold:  When pollen or mold spore counts are high, try to keep your windows closed.   Stay indoors with windows closed from late morning to afternoon, if you can. Pollen and some mold spore counts are highest at that time.   Ask your caregiver whether you need to take or increase anti-inflammatory medicine before your allergy season starts.  Irritants:   Tobacco smoke is an irritant. If you smoke, ask your caregiver how you can quit. Ask family  members to quit smoking, too. Do not allow smoking in your home or car.   If possible, do not use a wood-burning stove, kerosene heater, or fireplace. Minimize exposure to all sources of smoke, including incense, candles, fires, and fireworks.   Try to stay away from strong odors and sprays, such as perfume, talcum powder, hair spray, and paints.   Decrease humidity in your home and use an indoor air cleaning device. Reduce indoor humidity to below 60 percent. Dehumidifiers or central air conditioners can do this.   Try to have someone else vacuum for you once or twice a week, if you can. Stay out of rooms while they are being vacuumed and for a short while afterward.   If you vacuum, use a dust mask from a hardware store, a double-layered or microfilter vacuum cleaner bag, or a vacuum cleaner with a HEPA filter.   Sulfites in foods and beverages can be irritants. Do not drink beer or wine, or eat dried fruit, processed potatoes, or shrimp if they cause asthma   symptoms.   Cold air can trigger an asthma attack. Cover your nose and mouth with a scarf on cold or windy days.   Several health conditions can make asthma more difficult to manage, including runny nose, sinus infections, reflux disease, psychological stress, and sleep apnea. Your caregiver will treat these conditions, as well.   Avoid close contact with people who have a cold or the flu, since your asthma symptoms may get worse if you catch the infection from them. Wash your hands thoroughly after touching items that may have been handled by people with a respiratory infection.   Get a flu shot every year to protect against the flu virus, which often makes asthma worse for days or weeks. Also get a pneumonia shot once every five to 10 years.  Drugs:  Aspirin and other painkillers can cause asthma attacks. 10% to 20% of people with asthma have sensitivity to aspirin or a group of painkillers called non-steroidal anti-inflammatory drugs  (NSAIDS), such as ibuprofen and naproxen. These drugs are used to treat pain and reduce fevers. Asthma attacks caused by any of these medicines can be severe and even fatal. These drugs must be avoided in people who have known aspirin sensitive asthma. Products with acetaminophen are considered safe for people who have asthma. It is important that people with aspirin sensitivity read labels of all over-the-counter drugs used to treat pain, colds, coughs, and fever.   Beta blockers and ACE inhibitors are other drugs which you should discuss with your caregiver, in relation to your asthma.  ALLERGY SKIN TESTING  Ask your asthma caregiver about allergy skin testing or blood testing (RAST test) to identify the allergens to which you are sensitive. If you are found to have allergies, allergy shots (immunotherapy) for asthma may help prevent future allergies and asthma. With allergy shots, small doses of allergens (substances to which you are allergic) are injected under your skin on a regular schedule. Over a period of time, your body may become used to the allergen and less responsive with asthma symptoms. You can also take measures to minimize your exposure to those allergens. EXERCISE  If you have exercise-induced asthma, or are planning vigorous exercise, or exercise in cold, humid, or dry environments, prevent exercise-induced asthma by following your caregiver's advice regarding asthma treatment before exercising. Document Released: 10/09/2009 Document Revised: 10/10/2011 Document Reviewed: 10/09/2009 [facility] Patient Information 2012 [place], [place].Asthma, Acute Bronchospasm Your exam shows you have asthma, or acute bronchospasm that acts like asthma. Bronchospasm means your air passages become narrowed. These conditions are due to inflammation and airway spasm that cause narrowing of the bronchial tubes in the lungs. This causes you to have wheezing and shortness of breath. CAUSES  Respiratory  infections and allergies most often bring on these attacks. Smoking, air pollution, cold air, emotional upsets, and vigorous exercise can also bring them on.  TREATMENT   Treatment is aimed at making the narrowed airways larger. Mild asthma/bronchospasm is usually controlled with inhaled medicines. Albuterol is a common medicine that you breathe in to open spastic or narrowed airways. Some trade names for albuterol are Ventolin or Proventil. Steroid medicine is also used to reduce the inflammation when an attack is moderate or severe. Antibiotics (medications used to kill germs) are only used if a bacterial infection is present.   If you are pregnant and need to use Albuterol (Ventolin or Proventil), you can expect the baby to move more than usual shortly after the medicine is used.  HOME CARE  INSTRUCTIONS   Rest.   Drink plenty of liquids. This helps the mucus to remain thin and easily coughed up. Do not use caffeine or alcohol.   Do not smoke. Avoid being exposed to second-hand smoke.   You play a critical role in keeping yourself in good health. Avoid exposure to things that cause you to wheeze. Avoid exposure to things that cause you to have breathing problems. Keep your medications up-to-date and available. Carefully follow your doctor's treatment plan.   When pollen or pollution is bad, keep windows closed and use an air conditioner go to places with air conditioning. If you are allergic to furry pets or birds, find new homes for them or keep them outside.   Take your medicine exactly as prescribed.   Asthma requires careful medical attention. See your caregiver for follow-up as advised. If you are more than [redacted] weeks pregnant and you were prescribed any new medications, let your Obstetrician know about the visit and how you are doing. Arrange a recheck.  SEEK IMMEDIATE MEDICAL CARE IF:   You are getting worse.   You have trouble breathing. If severe, call 911.   You develop chest  pain or discomfort.   You are throwing up or not drinking fluids.   You are not getting better within 24 hours.   You are coughing up yellow, green, brown, or bloody sputum.   You develop a fever over 102 F (38.9 C).   You have trouble swallowing.  MAKE SURE YOU:   Understand these instructions.   Will watch your condition.   Will get help right away if you are not doing well or get worse.  Document Released: 02/05/2007 Document Revised: 10/10/2011 Document Reviewed: 10/05/2007 [facility] Patient Information 2012 [place], [place].  RESOURCE GUIDE  Dental Problems  Patients with Medicaid: [facility] [phone]                                           [phone] [address] [employer] Phone:  [phone]                                                  Phone:  [phone]  If unable to pay or uninsured, contact:  [facility] or [facility]. to become qualified for the adult dental clinic.  Chronic Pain Problems Contact [facility]  [phone] Patients need to be referred by their primary care doctor.  Insufficient Money for Medicine Contact [employer]:  call "211" or [facility] [phone].  No Primary Care Doctor Call [facility]  [phone] Other agencies that provide inexpensive medical care    [facility]  [phone]    [facility]  [phone]    [facility]  [phone]    [facility]  [phone]    Planned Parenthood  [phone]    [facility]  [phone]  Psychological Services [facility]  [phone] [facility]  [phone] [facility]   [phone] (emergency services 2073269305)  Substance Abuse Resources [facility]  [phone] [facility] [phone] [facility] [place] [phone] Floydene Flock [phone] Residential & Outpatient Substance Abuse Program   [phone]  Abuse/Neglect [facility] Child Abuse Hotline 215-840-2854 [facility] Child Abuse Hotline 713-688-4158 (After Hours)  Emergency Shelter [facility] [phone]  Maternity Homes [facility] [place] [facility] [phone] [facility] [phone]  MRSA Hotline #:   (870)403-1438    [facility] Resources  [facility] [place]     [employer]                          [facility] [address] [place]                       [address]      [address]  Blondell Reveal Phone:  [phone]                                   Phone:  [phone]                 Phone:  [phone]  [facility] Phone:  [phone]  [facility] Child Abuse Hotline [phone] (After Hours)

## 2014-03-28 ENCOUNTER — Emergency Department (HOSPITAL_COMMUNITY): Admission: EM | Admit: 2014-03-28 | Discharge: 2014-03-28 | Discharge status: Against Medical Advice | Payer: Self-pay

## 2015-06-17 ENCOUNTER — Emergency Department (HOSPITAL_COMMUNITY)
Admission: EM | Admit: 2015-06-17 | Discharge: 2015-06-17 | Disposition: A | Discharge status: Home / Self Care | Payer: Self-pay | Attending: Emergency Medicine | Admitting: Emergency Medicine

## 2015-06-17 ENCOUNTER — Encounter (HOSPITAL_COMMUNITY): Payer: Self-pay | Admitting: Emergency Medicine

## 2015-06-17 ENCOUNTER — Emergency Department (HOSPITAL_COMMUNITY): Payer: Self-pay

## 2015-06-17 DIAGNOSIS — J45901 Unspecified asthma with (acute) exacerbation: Secondary | ICD-10-CM | POA: Insufficient documentation

## 2015-06-17 MED ORDER — ALBUTEROL SULFATE HFA 108 (90 BASE) MCG/ACT IN AERS
2.0000 | INHALATION_SPRAY | Freq: Once | RESPIRATORY_TRACT | Status: AC
  Administered 2015-06-17: 2 via RESPIRATORY_TRACT
  Filled 2015-06-17: qty 6.7

## 2015-06-17 MED ORDER — DEXAMETHASONE SODIUM PHOSPHATE 10 MG/ML IJ SOLN
10.0000 mg | Freq: Once | INTRAMUSCULAR | Status: AC
  Administered 2015-06-17: 10 mg via INTRAMUSCULAR
  Filled 2015-06-17: qty 1

## 2015-06-17 MED ORDER — ALBUTEROL SULFATE (2.5 MG/3ML) 0.083% IN NEBU
5.0000 mg | INHALATION_SOLUTION | Freq: Once | RESPIRATORY_TRACT | Status: AC
  Administered 2015-06-17: 5 mg via RESPIRATORY_TRACT
  Filled 2015-06-17: qty 6

## 2015-06-17 NOTE — Discharge Instructions (Signed)
Read the information below.  You may return to the Emergency Department at any time for worsening condition or any new symptoms that concern you.  If you develop worsening shortness of breath, uncontrolled wheezing, severe chest pain, or fevers despite using tylenol and/or ibuprofen, return for a recheck.      Asthma Asthma is a recurring condition in which the airways tighten and narrow. Asthma can make it difficult to breathe. It can cause coughing, wheezing, and shortness of breath. Asthma episodes, also called asthma attacks, range from minor to life-threatening. Asthma cannot be cured, but medicines and lifestyle changes can help control it. CAUSES Asthma is believed to be caused by inherited (genetic) and environmental factors, but its exact cause is unknown. Asthma may be triggered by allergens, lung infections, or irritants in the air. Asthma triggers are different for each person. Common triggers include:   Animal dander.  Dust mites.  Cockroaches.  Pollen from trees or grass.  Mold.  Smoke.  Air pollutants such as dust, household cleaners, hair sprays, aerosol sprays, paint fumes, strong chemicals, or strong odors.  Cold air, weather changes, and winds (which increase molds and pollens in the air).  Strong emotional expressions such as crying or laughing hard.  Stress.  Certain medicines (such as aspirin) or types of drugs (such as beta-blockers).  Sulfites in foods and drinks. Foods and drinks that may contain sulfites include dried fruit, potato chips, and sparkling grape juice.  Infections or inflammatory conditions such as the flu, a cold, or an inflammation of the nasal membranes (rhinitis).  Gastroesophageal reflux disease (GERD).  Exercise or strenuous activity. SYMPTOMS Symptoms may occur immediately after asthma is triggered or many hours later. Symptoms include:  Wheezing.  Excessive nighttime or early morning coughing.  Frequent or severe coughing with  a common cold.  Chest tightness.  Shortness of breath. DIAGNOSIS  The diagnosis of asthma is made by a review of your medical history and a physical exam. Tests may also be performed. These may include:  Lung function studies. These tests show how much air you breathe in and out.  Allergy tests.  Imaging tests such as X-rays. TREATMENT  Asthma cannot be cured, but it can usually be controlled. Treatment involves identifying and avoiding your asthma triggers. It also involves medicines. There are 2 classes of medicine used for asthma treatment:   Controller medicines. These prevent asthma symptoms from occurring. They are usually taken every day.  Reliever or rescue medicines. These quickly relieve asthma symptoms. They are used as needed and provide short-term relief. Your health care provider will help you create an asthma action plan. An asthma action plan is a written plan for managing and treating your asthma attacks. It includes a list of your asthma triggers and how they may be avoided. It also includes information on when medicines should be taken and when their dosage should be changed. An action plan may also involve the use of a device called a peak flow meter. A peak flow meter measures how well the lungs are working. It helps you monitor your condition. HOME CARE INSTRUCTIONS   Take medicines only as directed by your health care provider. Speak with your health care provider if you have questions about how or when to take the medicines.  Use a peak flow meter as directed by your health care provider. Record and keep track of readings.  Understand and use the action plan to help minimize or stop an asthma attack without needing to  seek medical care.  Control your home environment in the following ways to help prevent asthma attacks:  Do not smoke. Avoid being exposed to secondhand smoke.  Change your heating and air conditioning filter regularly.  Limit your use of  fireplaces and wood stoves.  Get rid of pests (such as roaches and mice) and their droppings.  Throw away plants if you see mold on them.  Clean your floors and dust regularly. Use unscented cleaning products.  Try to have someone else vacuum for you regularly. Stay out of rooms while they are being vacuumed and for a short while afterward. If you vacuum, use a dust mask from a hardware store, a double-layered or microfilter vacuum cleaner bag, or a vacuum cleaner with a HEPA filter.  Replace carpet with wood, tile, or vinyl flooring. Carpet can trap dander and dust.  Use allergy-proof pillows, mattress covers, and box spring covers.  Wash bed sheets and blankets every week in hot water and dry them in a dryer.  Use blankets that are made of polyester or cotton.  Clean bathrooms and kitchens with bleach. If possible, have someone repaint the walls in these rooms with mold-resistant paint. Keep out of the rooms that are being cleaned and painted.  Wash hands frequently. SEEK MEDICAL CARE IF:   You have wheezing, shortness of breath, or a cough even if taking medicine to prevent attacks.  The colored mucus you cough up (sputum) is thicker than usual.  Your sputum changes from clear or white to yellow, green, gray, or bloody.  You have any problems that may be related to the medicines you are taking (such as a rash, itching, swelling, or trouble breathing).  You are using a reliever medicine more than 2-3 times per week.  Your peak flow is still at 50-79% of your personal best after following your action plan for 1 hour.  You have a fever. SEEK IMMEDIATE MEDICAL CARE IF:   You seem to be getting worse and are unresponsive to treatment during an asthma attack.  You are short of breath even at rest.  You get short of breath when doing very little physical activity.  You have difficulty eating, drinking, or talking due to asthma symptoms.  You develop chest pain.  You  develop a fast heartbeat.  You have a bluish color to your lips or fingernails.  You are light-headed, dizzy, or faint.  Your peak flow is less than 50% of your personal best. MAKE SURE YOU:   Understand these instructions.  Will watch your condition.  Will get help right away if you are not doing well or get worse. Document Released: 10/21/2005 Document Revised: 03/07/2014 Document Reviewed: 05/20/2013 [facility] Patient Information 2015 [place], [place]. This information is not intended to replace advice given to you by your health care provider. Make sure you discuss any questions you have with your health care provider.  Asthma Attack Prevention Although there is no way to prevent asthma from starting, you can take steps to control the disease and reduce its symptoms. Learn about your asthma and how to control it. Take an active role to control your asthma by working with your health care provider to create and follow an asthma action plan. An asthma action plan guides you in:  Taking your medicines properly.  Avoiding things that set off your asthma or make your asthma worse (asthma triggers).  Tracking your level of asthma control.  Responding to worsening asthma.  Seeking emergency care when needed.  To track your asthma, keep records of your symptoms, check your peak flow number using a handheld device that shows how well air moves out of your lungs (peak flow meter), and get regular asthma checkups.  WHAT ARE SOME WAYS TO PREVENT AN ASTHMA ATTACK?  Take medicines as directed by your health care provider.  Keep track of your asthma symptoms and level of control.  With your health care provider, write a detailed plan for taking medicines and managing an asthma attack. Then be sure to follow your action plan. Asthma is an ongoing condition that needs regular monitoring and treatment.  Identify and avoid asthma triggers. Many outdoor allergens and irritants (such as pollen,  mold, cold air, and air pollution) can trigger asthma attacks. Find out what your asthma triggers are and take steps to avoid them.  Monitor your breathing. Learn to recognize warning signs of an attack, such as coughing, wheezing, or shortness of breath. Your lung function may decrease before you notice any signs or symptoms, so regularly measure and record your peak airflow with a home peak flow meter.  Identify and treat attacks early. If you act quickly, you are less likely to have a severe attack. You will also need less medicine to control your symptoms. When your peak flow measurements decrease and alert you to an upcoming attack, take your medicine as instructed and immediately stop any activity that may have triggered the attack. If your symptoms do not improve, get medical help.  Pay attention to increasing quick-relief inhaler use. If you find yourself relying on your quick-relief inhaler, your asthma is not under control. See your health care provider about adjusting your treatment. WHAT CAN MAKE MY SYMPTOMS WORSE? A number of common things can set off or make your asthma symptoms worse and cause temporary increased inflammation of your airways. Keep track of your asthma symptoms for several weeks, detailing all the environmental and emotional factors that are linked with your asthma. When you have an asthma attack, go back to your asthma diary to see which factor, or combination of factors, might have contributed to it. Once you know what these factors are, you can take steps to control many of them. If you have allergies and asthma, it is important to take asthma prevention steps at home. Minimizing contact with the substance to which you are allergic will help prevent an asthma attack. Some triggers and ways to avoid these triggers are: Animal Dander:  Some people are allergic to the flakes of skin or dried saliva from animals with fur or feathers.   There is no such thing as a  hypoallergenic dog or cat breed. All dogs or cats can cause allergies, even if they don't shed.  Keep these pets out of your home.  If you are not able to keep a pet outdoors, keep the pet out of your bedroom and other sleeping areas at all times, and keep the door closed.  Remove carpets and furniture covered with cloth from your home. If that is not possible, keep the pet away from fabric-covered furniture and carpets. Dust Mites: Many people with asthma are allergic to dust mites. Dust mites are tiny bugs that are found in every home in mattresses, pillows, carpets, fabric-covered furniture, bedcovers, clothes, stuffed toys, and other fabric-covered items.   Cover your mattress in a special dust-proof cover.  Cover your pillow in a special dust-proof cover, or wash the pillow each week in hot water. Water must be hotter than 130  F (54.4 C) to kill dust mites. Cold or warm water used with detergent and bleach can also be effective.  Wash the sheets and blankets on your bed each week in hot water.  Try not to sleep or lie on cloth-covered cushions.  Call ahead when traveling and ask for a smoke-free hotel room. Bring your own bedding and pillows in case the hotel only supplies feather pillows and down comforters, which may contain dust mites and cause asthma symptoms.  Remove carpets from your bedroom and those laid on concrete, if you can.  Keep stuffed toys out of the bed, or wash the toys weekly in hot water or cooler water with detergent and bleach. Cockroaches: Many people with asthma are allergic to the droppings and remains of cockroaches.   Keep food and garbage in closed containers. Never leave food out.  Use poison baits, traps, powders, gels, or paste (for example, boric acid).  If a spray is used to kill cockroaches, stay out of the room until the odor goes away. Indoor Mold:  Fix leaky faucets, pipes, or other sources of water that have mold around them.  Clean  floors and moldy surfaces with a fungicide or diluted bleach.  Avoid using humidifiers, vaporizers, or swamp coolers. These can spread molds through the air. Pollen and Outdoor Mold:  When pollen or mold spore counts are high, try to keep your windows closed.  Stay indoors with windows closed from late morning to afternoon. Pollen and some mold spore counts are highest at that time.  Ask your health care provider whether you need to take anti-inflammatory medicine or increase your dose of the medicine before your allergy season starts. Other Irritants to Avoid:  Tobacco smoke is an irritant. If you smoke, ask your health care provider how you can quit. Ask family members to quit smoking, too. Do not allow smoking in your home or car.  If possible, do not use a wood-burning stove, kerosene heater, or fireplace. Minimize exposure to all sources of smoke, including incense, candles, fires, and fireworks.  Try to stay away from strong odors and sprays, such as perfume, talcum powder, hair spray, and paints.  Decrease humidity in your home and use an indoor air cleaning device. Reduce indoor humidity to below 60%. Dehumidifiers or central air conditioners can do this.  Decrease house dust exposure by changing furnace and air cooler filters frequently.  Try to have someone else vacuum for you once or twice a week. Stay out of rooms while they are being vacuumed and for a short while afterward.  If you vacuum, use a dust mask from a hardware store, a double-layered or microfilter vacuum cleaner bag, or a vacuum cleaner with a HEPA filter.  Sulfites in foods and beverages can be irritants. Do not drink beer or wine or eat dried fruit, processed potatoes, or shrimp if they cause asthma symptoms.  Cold air can trigger an asthma attack. Cover your nose and mouth with a scarf on cold or windy days.  Several health conditions can make asthma more difficult to manage, including a runny nose, sinus  infections, reflux disease, psychological stress, and sleep apnea. Work with your health care provider to manage these conditions.  Avoid close contact with people who have a respiratory infection such as a cold or the flu, since your asthma symptoms may get worse if you catch the infection. Wash your hands thoroughly after touching items that may have been handled by people with a respiratory infection.  Get a flu shot every year to protect against the flu virus, which often makes asthma worse for days or weeks. Also get a pneumonia shot if you have not previously had one. Unlike the flu shot, the pneumonia shot does not need to be given yearly. Medicines:  Talk to your health care provider about whether it is safe for you to take aspirin or non-steroidal anti-inflammatory medicines (NSAIDs). In a small number of people with asthma, aspirin and NSAIDs can cause asthma attacks. These medicines must be avoided by people who have known aspirin-sensitive asthma. It is important that people with aspirin-sensitive asthma read labels of all over-the-counter medicines used to treat pain, colds, coughs, and fever.  Beta-blockers and ACE inhibitors are other medicines you should discuss with your health care provider. HOW CAN I FIND OUT WHAT I AM ALLERGIC TO? Ask your asthma health care provider about allergy skin testing or blood testing (the RAST test) to identify the allergens to which you are sensitive. If you are found to have allergies, the most important thing to do is to try to avoid exposure to any allergens that you are sensitive to as much as possible. Other treatments for allergies, such as medicines and allergy shots (immunotherapy) are available.  CAN I EXERCISE? Follow your health care provider's advice regarding asthma treatment before exercising. It is important to maintain a regular exercise program, but vigorous exercise or exercise in cold, humid, or dry environments can cause asthma attacks,  especially for those people who have exercise-induced asthma. Document Released: 10/09/2009 Document Revised: 10/26/2013 Document Reviewed: 04/28/2013 [facility] Patient Information 2015 [place], [place]. This information is not intended to replace advice given to you by your health care provider. Make sure you discuss any questions you have with your health care provider.    Emergency Department Resource Guide 1) Find a Doctor and Pay Out of Pocket Although you won't have to find out who is covered by your insurance plan, it is a good idea to ask around and get recommendations. You will then need to call the office and see if the doctor you have chosen will accept you as a new patient and what types of options they offer for patients who are self-pay. Some doctors offer discounts or will set up payment plans for their patients who do not have insurance, but you will need to ask so you aren't surprised when you get to your appointment.  2) Contact Your Local Health Department Not all health departments have doctors that can see patients for sick visits, but many do, so it is worth a call to see if yours does. If you don't know where your local health department is, you can check in your phone book. The CDC also has a tool to help you locate your state's health department, and many state websites also have listings of all of their local health departments.  3) Find a Walk-in Clinic If your illness is not likely to be very severe or complicated, you may want to try a walk in clinic. These are popping up all over the country in pharmacies, drugstores, and shopping centers. They're usually staffed by nurse practitioners or physician assistants that have been trained to treat common illnesses and complaints. They're usually fairly quick and inexpensive. However, if you have serious medical issues or chronic medical problems, these are probably not your best option.  No Primary Care Doctor: - Call Health  Connect at  [phone] - they can help you locate a primary  care doctor that  accepts your insurance, provides certain services, etc. - Physician Referral Service- [phone]  Chronic Pain Problems: Organization         Address  Phone   Notes  [facility]  [phone] Patients need to be referred by their primary care doctor.   Medication Assistance: Organization         Address  Phone   Notes  [facility] Medication [facility] [address] [address] [place], [place] [address] [phone] --Must be a resident of [facility] -- Must have NO insurance coverage whatsoever (no Medicaid/ Medicare, etc.) -- The pt. MUST have a primary care doctor that directs their care regularly and follows them in the community   [facility]  [phone]   [employer]  [phone]    Agencies that provide inexpensive medical care: Organization         Address  Phone   Notes  [facility]  [phone]   [facility]    [phone]   [facility] [address] [place], [place] [address] [phone]   [facility] [address], [place] [phone]   Planned Parenthood    [phone]   [facility]    [phone]   [facility]  [address], [place] Phone:  [phone], Fax:  [phone] Hours of Operation:  9 am - 6 pm, M-F.  Also accepts Medicaid/Medicare and self-pay.  [facility]  [address], [place] Phone: [phone], Fax: [phone]. Hours of Operation:  8:30 am - 5:30 pm, M-F.  Also accepts Medicaid and self-pay.  [facility] [place] [address] [place] Phone: [phone]   [facility] [address] Natasha Bence [place], [place] [phone], Ext. [phone] Mondays & Thursdays: 7-9 AM.  First 15 patients are seen on a first come, first serve basis.     Medicaid-accepting [facility] Providers:  Organization         Address  Phone   Notes  [facility] [address], [place] [phone] Also accepts self-pay patients.  [facility] [address] Laurell Josephs [place], [place]  [phone]   [facility] [address], [place] [phone]   [facility] [address], [place] [phone]   Renaye Rakers [address], [place]   [phone] Only accepts [place] [facility] [place] patients after they have their name applied to their card.   Self-Pay (no insurance) in [facility]:  Organization         Address  Phone   Notes  Sickle Cell Patients, [facility] [address] [place], [place] [phone]   [facility] [address] [place], [place] [phone]   [facility] [place]  [address],  [phone]   [facility]/Dr. Osei-Bonsu  [address], [place] or [address], [place] [phone] Phone number for both [place] and [place] locations is the same.  [facility] [address], [place] [phone]   [facility] [address], [place] or [address] [phone]  [facility] [address] [place] [phone], phone; [phone], fax Sees patients 1st and 3rd Saturday of every month.  Must not qualify for public or private insurance (i.e. Medicaid, Medicare, [place] Health Choice, Veterans' Benefits)  Household income should be no more than 200% of the poverty level The clinic cannot treat you if you are pregnant or think you are pregnant  Sexually transmitted diseases are not treated at the clinic.    Dental Care: Organization         Address  Phone  Notes  [facility]  Department of [facility] [address] [place], [place] [phone] Accepts children up to age 37 who are enrolled in [place] or [place] Health Choice; pregnant women with a Medicaid card; and children who have applied for Medicaid or [place] Health Choice, but were declined, whose parents can pay a reduced fee at time of service.  [facility] Department of [facility]  [address], [place] [phone] Accepts children up to age 53 who are enrolled in [place] or [place] Health Choice; pregnant women with a Medicaid card; and children who have applied for Medicaid or [place] Health Choice, but were declined, whose parents can pay a reduced fee at time of service.  [facility]  [address] [place], [place] [phone] Patients are seen by appointment only. Walk-ins are not accepted. [facility] will see patients 1 years of age and older. Monday - Tuesday (8am-5pm) Most Wednesdays (8:30-5pm) $30 per visit, cash only  [facility]  [address], [facility] [phone] Patients are seen by appointment only. Walk-ins are not accepted. [facility] will see patients 54 years of age and older. One Wednesday Evening (Monthly: Volunteer Based).  $30 per visit, cash only  [employer]  [phone] for adults; Children under age 60, call Graduate Pediatric Dentistry at [phone]. Children aged 4-14, please call [phone] to request a pediatric application.  Dental services are provided in all areas of dental care including fillings, crowns and bridges, complete and partial dentures, implants, gum treatment, root canals, and extractions. Preventive care is also provided. Treatment is provided to both adults and children. Patients are selected via a lottery and there is often a waiting list.   [facility] [address] [place]  [phone]  [email or web address]   [facility] [address] [place], [place] [phone], Ext. [phone] Second and Fourth Thursday of each month, opens at 6:30 AM; Clinic ends at 9 AM.  Patients are seen on a first-come first-served basis, and a limited number are seen during each clinic.   [facility]  [address] Ether Griffins [place], [place] [phone]   Eligibility Requirements You must have lived in [place], [place], or [place] counties for at least the last three months.   You cannot be eligible for state or federal sponsored [place], including [employer], [place], or [employer].   You generally cannot be eligible for healthcare insurance through your employer.    How to apply: Eligibility screenings are held every Tuesday and Wednesday afternoon from 1:00 pm until 4:00 pm. You do not need an appointment for the interview!  [facility] [address], [place], [place] [phone]   [facility] Health Department  [phone]   [facility] Health Department  [phone]   [facility]  Department  [phone]    Behavioral Health Resources in the Community: Intensive Outpatient Programs Organization         Address  Phone  Notes  [facility] [address] [place], [place] [phone]   [facility] Outpatient [address] [place], [place] [phone]   [facility] [address] [place], [place]  [phone]   [facility] [address],  [place], [place] [phone] or [phone]   Substance Abuse Resources Organization         Address  Phone  Notes  [facility]  [phone]   [facility]  [phone]   [facility]  [phone]   Floydene Flock  [phone]   Residential & Outpatient Substance Abuse Program  [phone]   Psychological Services Organization          Address  Phone  Notes  [facility]  [phone]   [facility]  [phone]   [facility] Mental Health [address], [place] [phone] or [phone]    Mobile Crisis Teams Organization         Address  Phone  Notes  [facility], Mobile Crisis Care Unit  [phone]   [facility]  [address] [place], [place] [phone]   Doristine Locks [address] [place] [phone]    Self-Help/Support Groups Organization         Address  Phone             Notes  [facility] [place] - variety of support groups  [phone]- I7437963 Call for more information  Narcotics Anonymous (NA), [facility] [address], [employer] [place]  2 meetings at this location   Statistician         Address  Phone  Notes  [facility] [address],    [place]  [phone]   [facility]  [address] [place] [address], [place], [place] [phone]   [facility] [address] [place], [place] [phone] Admissions: 8am-3pm M-F  [facility] [address]    [place], [place] [phone]   [facility] [address] [place], [place], [place] [phone]   [facility] [address]  [place], [place] [phone]   [facility] - Intensive Outpatient [address], [place], [place] [phone]   [facility] ([facility] [address] [place]  [place], [place] [phone] or [phone]   [facility] ([facility] [address] [place], [place] [phone] Accepts Medicaid  [facility] [address]  [place] [phone] Substance Abuse/Addiction Treatment   [facility] Organization         Address  Phone  Notes  [facility]  [phone]   Angie Fava, PhD [address] Knack [place], [place]   [phone] or [phone]   [facility]   [address] [place], [place] [phone]   [facility] [address], [place], [place] [phone] Insurance/Medicaid/sponsorship through [employer] and Families [address]                                    [place], [place] [phone] Therapy/tele-psych/case  [facility]  [facility] [place], [place] [phone]    Dr. Adele Schilder  [phone]   [facility] Dept. 1) [address], [place] 2) [place] 3)  [address], [place] [phone]  [phone]   [place] [phone] or [phone] (After Hours)

## 2015-06-17 NOTE — ED Provider Notes (Signed)
CSN: [id number]     Arrival date & time 06/17/15  0536 History   First MD Initiated Contact with Patient 06/17/15 [phone]     Chief Complaint  Patient presents with  . Shortness of Breath     (Consider location/radiation/quality/duration/timing/severity/associated sxs/prior Treatment) HPI   Pt with hx asthma who has been out of his inhaler for the past 3 months developed SOB and wheezing overnight.  States he has been feeling well until overnight and is not sure what set off his asthma.  He very rarely needs to use his inhaler.  Denies fevers, CP, cough, other URI symptoms.  He does not use control medications at home for asthma.  No exposure to smoke, allergens.    Past Medical History  Diagnosis Date  . Asthma    History reviewed. No pertinent past surgical history. No family history on file. Social History  Substance Use Topics  . Smoking status: Never Smoker   . Smokeless tobacco: None  . Alcohol Use: Yes    Review of Systems  All other systems reviewed and are negative.     Allergies  Review of patient's allergies indicates no known allergies.  Home Medications   Prior to Admission medications   Medication Sig Start Date End Date Taking? Authorizing Provider  albuterol (PROVENTIL HFA;VENTOLIN HFA) 108 (90 BASE) MCG/ACT inhaler Inhale 2 puffs into the lungs every 6 (six) hours as needed.    Historical Provider, MD  albuterol (PROVENTIL HFA;VENTOLIN HFA) 108 (90 BASE) MCG/ACT inhaler Inhale 2 puffs into the lungs every 4 (four) hours as needed for wheezing or shortness of breath. Patient not taking: Reported on 06/17/2015 02/27/12 02/26/13  Raeford Razor, MD   BP 137/85 mmHg  Pulse 72  Temp(Src) 97.7 F (36.5 C) (Oral)  Resp 20  SpO2 99% Physical Exam  Constitutional: He appears well-developed and well-nourished. No distress.  HENT:  Head: Normocephalic and atraumatic.  Neck: Neck supple.  Cardiovascular: Normal rate and regular rhythm.   Pulmonary/Chest: Effort  normal and breath sounds normal. No respiratory distress. He has no wheezes. He has no rales.  Abdominal: Soft. He exhibits no distension and no mass. There is no tenderness. There is no rebound and no guarding.  Neurological: He is alert. He exhibits normal muscle tone.  Skin: He is not diaphoretic.  Nursing note and vitals reviewed.   ED Course  Procedures (including critical care time) Labs Review Labs Reviewed - No data to display  Imaging Review Dg Chest 2 View  06/17/2015   CLINICAL DATA:  Shortness of breath since 0300 hrs  EXAM: CHEST  2 VIEW  COMPARISON:  04/27/2011  FINDINGS: The heart size and mediastinal contours are within normal limits. Both lungs are clear. The visualized skeletal structures are unremarkable.  IMPRESSION: No active cardiopulmonary disease.   Electronically Signed   By: Elige Ko   On: 06/17/2015 06:59   I, Crescencio Jozwiak, personally reviewed and evaluated these images and lab results as part of my medical decision-making.   EKG Interpretation None       7:30 AM Lungs CTAB.  Pt reports great improvement.   MDM   Final diagnoses:  None    Afebrile, nontoxic patient with hx asthma, out of medications, with asthma attack.  Improved with nebs. D/C home with albuterol inhaler, PCP resources for follow up.  Discussed result, findings, treatment, and follow up  with patient.  Pt given return precautions.  Pt verbalizes understanding and agrees with plan.  [place], PA-C 06/17/15 1610  Tomasita Crumble, MD 06/17/15 [phone]

## 2015-06-17 NOTE — ED Notes (Signed)
Pt from home c/o shortness of breath since 0300 this a.m. Wheezing in left upper field diminished lowers bilaterally. Hx of asthma. He reports he is out of his inhaler.

## 2015-07-04 ENCOUNTER — Encounter (HOSPITAL_COMMUNITY): Payer: Self-pay | Admitting: Emergency Medicine

## 2015-07-04 ENCOUNTER — Emergency Department (HOSPITAL_COMMUNITY)
Admission: EM | Admit: 2015-07-04 | Discharge: 2015-07-04 | Disposition: A | Discharge status: Home / Self Care | Payer: Self-pay | Attending: Emergency Medicine | Admitting: Emergency Medicine

## 2015-07-04 DIAGNOSIS — Z79899 Other long term (current) drug therapy: Secondary | ICD-10-CM | POA: Insufficient documentation

## 2015-07-04 DIAGNOSIS — J45909 Unspecified asthma, uncomplicated: Secondary | ICD-10-CM | POA: Insufficient documentation

## 2015-07-04 DIAGNOSIS — Z202 Contact with and (suspected) exposure to infections with a predominantly sexual mode of transmission: Secondary | ICD-10-CM | POA: Insufficient documentation

## 2015-07-04 LAB — URINALYSIS, ROUTINE W REFLEX MICROSCOPIC
Bilirubin Urine: NEGATIVE
Glucose, UA: NEGATIVE mg/dL
HGB URINE DIPSTICK: NEGATIVE
Ketones, ur: 40 mg/dL — AB
NITRITE: NEGATIVE
PROTEIN: NEGATIVE mg/dL
SPECIFIC GRAVITY, URINE: 1.017 (ref 1.005–1.030)
UROBILINOGEN UA: 0.2 mg/dL (ref 0.0–1.0)
pH: 5.5 (ref 5.0–8.0)

## 2015-07-04 LAB — URINE MICROSCOPIC-ADD ON

## 2015-07-04 MED ORDER — METRONIDAZOLE 500 MG PO TABS
500.0000 mg | ORAL_TABLET | Freq: Once | ORAL | Status: AC
  Administered 2015-07-04: 500 mg via ORAL
  Filled 2015-07-04: qty 1

## 2015-07-04 MED ORDER — LIDOCAINE HCL (PF) 1 % IJ SOLN
0.9000 mL | Freq: Once | INTRAMUSCULAR | Status: AC
  Administered 2015-07-04: 0.9 mL
  Filled 2015-07-04: qty 5

## 2015-07-04 MED ORDER — AZITHROMYCIN 250 MG PO TABS
1000.0000 mg | ORAL_TABLET | Freq: Once | ORAL | Status: AC
  Administered 2015-07-04: 1000 mg via ORAL
  Filled 2015-07-04: qty 4

## 2015-07-04 MED ORDER — CEFTRIAXONE SODIUM 250 MG IJ SOLR
250.0000 mg | Freq: Once | INTRAMUSCULAR | Status: AC
  Administered 2015-07-04: 250 mg via INTRAMUSCULAR
  Filled 2015-07-04: qty 250

## 2015-07-04 MED ORDER — ONDANSETRON HCL 4 MG PO TABS: 4.0000 mg | ORAL_TABLET | Freq: Three times a day (TID) | ORAL | Status: AC | PRN

## 2015-07-04 MED ORDER — ONDANSETRON HCL 4 MG PO TABS
4.0000 mg | ORAL_TABLET | Freq: Once | ORAL | Status: AC
  Administered 2015-07-04: 4 mg via ORAL
  Filled 2015-07-04: qty 1

## 2015-07-04 MED ORDER — METRONIDAZOLE 500 MG PO TABS: 500.0000 mg | ORAL_TABLET | Freq: Two times a day (BID) | ORAL | Status: AC

## 2015-07-04 NOTE — ED Provider Notes (Signed)
CSN: [id number]     Arrival date & time 07/04/15  1621 History  This chart was scribed for non-physician practitioner, Danelle Berry, PA-C, working with Pricilla Loveless, MD by Freida Busman, ED Scribe. This patient was seen in room WTR6/WTR6 and the patient's care was started at 5:07 PM.    Chief Complaint  Patient presents with  . Exposure to STD   The history is provided by the patient. No language interpreter was used.   HPI Comments:  Eric Mendoza is a 39 y.o. male who presents to the Emergency Department for STD testing. Pt states he was recently exposed to trichomonas by his girlfriend who tested positive. He denies dysuria, hematuria, urinary frequency, urinary frequency, penile discharge, fever, chills and testicular/scrotal pain, N, V.    Past Medical History  Diagnosis Date  . Asthma    History reviewed. No pertinent past surgical history. History reviewed. No pertinent family history. Social History  Substance Use Topics  . Smoking status: Never Smoker   . Smokeless tobacco: None  . Alcohol Use: Yes    Review of Systems  Constitutional: Negative for fever and chills.       + STD exposure   Genitourinary: Negative for dysuria, discharge and testicular pain.    Allergies  Review of patient's allergies indicates no known allergies.  Home Medications   Prior to Admission medications   Medication Sig Start Date End Date Taking? Authorizing Provider  albuterol (PROVENTIL HFA;VENTOLIN HFA) 108 (90 BASE) MCG/ACT inhaler Inhale 2 puffs into the lungs every 6 (six) hours as needed.    Historical Provider, MD  albuterol (PROVENTIL HFA;VENTOLIN HFA) 108 (90 BASE) MCG/ACT inhaler Inhale 2 puffs into the lungs every 4 (four) hours as needed for wheezing or shortness of breath. Patient not taking: Reported on 06/17/2015 02/27/12 02/26/13  Raeford Razor, MD  metroNIDAZOLE (FLAGYL) 500 MG tablet Take 1 tablet (500 mg total) by mouth 2 (two) times daily. 07/04/15   Danelle Berry, PA-C   ondansetron (ZOFRAN) 4 MG tablet Take 1 tablet (4 mg total) by mouth every 8 (eight) hours as needed for nausea or vomiting. 07/04/15   Danelle Berry, PA-C   BP 134/73 mmHg  Pulse 91  Temp(Src) 98.8 F (37.1 C) (Oral)  Resp 20  SpO2 100% Physical Exam  Constitutional: He is oriented to person, place, and time. Vital signs are normal. He appears well-developed and well-nourished. He is cooperative. He does not appear ill. No distress.  HENT:  Head: Normocephalic and atraumatic.  Right Ear: External ear normal.  Left Ear: External ear normal.  Nose: Nose normal.  Mouth/Throat: Oropharynx is clear and moist. No oropharyngeal exudate.  Eyes: Conjunctivae and EOM are normal. Pupils are equal, round, and reactive to light. Right eye exhibits no discharge. Left eye exhibits no discharge. No scleral icterus.  Neck: Normal range of motion. Neck supple. No JVD present. No tracheal deviation present.  Cardiovascular: Normal rate and regular rhythm.   Pulmonary/Chest: Effort normal and breath sounds normal. No stridor. No respiratory distress.  Abdominal: Hernia confirmed negative in the right inguinal area and confirmed negative in the left inguinal area.  Genitourinary: Testes normal and penis normal. Right testis shows no mass, no swelling and no tenderness. Left testis shows no mass, no swelling and no tenderness. Circumcised. No phimosis, paraphimosis, hypospadias, penile erythema or penile tenderness. No discharge found.  No lesions, no rash   Musculoskeletal: Normal range of motion. He exhibits no edema.  Lymphadenopathy:    He has  no cervical adenopathy.       Right: No inguinal adenopathy present.       Left: No inguinal adenopathy present.  Neurological: He is alert and oriented to person, place, and time. He exhibits normal muscle tone. Coordination normal.  Skin: Skin is warm and dry. No rash noted. He is not diaphoretic. No erythema. No pallor.  Psychiatric: He has a normal mood and  affect. His behavior is normal. Judgment and thought content normal.    ED Course  Procedures   DIAGNOSTIC STUDIES:  Oxygen Saturation is 98% on RA, normal by my interpretation.    COORDINATION OF CARE:  5:10 PM Discussed treatment plan with pt at bedside and pt agreed to plan.  Labs Review Labs Reviewed  URINALYSIS, ROUTINE W REFLEX MICROSCOPIC (NOT AT [facility]) - Abnormal; Notable for the following:    Ketones, ur 40 (*)    Leukocytes, UA MODERATE (*)    All other components within normal limits  URINE MICROSCOPIC-ADD ON  RPR  HIV ANTIBODY (ROUTINE TESTING)  GC/CHLAMYDIA PROBE AMP ([place]) NOT AT [facility]    Imaging Review No results found. I have personally reviewed and evaluated these images and lab results as part of my medical decision-making.   EKG Interpretation None      MDM   Final diagnoses:  STD exposure    Male with STD exposure per girlfriend who tested positive for Trich.  Asymptomatic, requesting testing Will do full panel and treat with 1x dose of rocephin, azithro, and flagyl script 500 mg BID x 7d  UA microscopy negative for trich Pt informed that he will be updated on pending results.   I personally performed the services described in this documentation, which was scribed in my presence. The recorded information has been reviewed and is accurate.    Danelle Berry, PA-C 07/04/15 1849  Pricilla Loveless, MD 07/08/15 0040

## 2015-07-04 NOTE — ED Notes (Signed)
Pt states that his girlfriend told him he may be infected with an STD so he came to get checked. Denies any drainage. Alert and oriented.

## 2015-07-04 NOTE — Discharge Instructions (Signed)

## 2015-07-05 LAB — HIV ANTIBODY (ROUTINE TESTING W REFLEX): HIV Screen 4th Generation wRfx: NONREACTIVE

## 2015-07-05 LAB — GC/CHLAMYDIA PROBE AMP (~~LOC~~) NOT AT ARMC
CHLAMYDIA, DNA PROBE: NEGATIVE
NEISSERIA GONORRHEA: NEGATIVE

## 2015-07-05 LAB — RPR: RPR: NONREACTIVE

## 2015-08-07 ENCOUNTER — Emergency Department
Admission: EM | Admit: 2015-08-07 | Discharge: 2015-08-07 | Disposition: A | Discharge status: Home / Self Care | Payer: Self-pay | Attending: Emergency Medicine | Admitting: Emergency Medicine

## 2015-08-07 ENCOUNTER — Emergency Department: Payer: Self-pay

## 2015-08-07 ENCOUNTER — Encounter: Payer: Self-pay | Admitting: Emergency Medicine

## 2015-08-07 DIAGNOSIS — Y9301 Activity, walking, marching and hiking: Secondary | ICD-10-CM | POA: Insufficient documentation

## 2015-08-07 DIAGNOSIS — Y998 Other external cause status: Secondary | ICD-10-CM | POA: Insufficient documentation

## 2015-08-07 DIAGNOSIS — S20211A Contusion of right front wall of thorax, initial encounter: Secondary | ICD-10-CM | POA: Insufficient documentation

## 2015-08-07 DIAGNOSIS — S0990XA Unspecified injury of head, initial encounter: Secondary | ICD-10-CM | POA: Insufficient documentation

## 2015-08-07 DIAGNOSIS — S0093XA Contusion of unspecified part of head, initial encounter: Secondary | ICD-10-CM

## 2015-08-07 DIAGNOSIS — Z79899 Other long term (current) drug therapy: Secondary | ICD-10-CM | POA: Insufficient documentation

## 2015-08-07 DIAGNOSIS — H1131 Conjunctival hemorrhage, right eye: Secondary | ICD-10-CM | POA: Insufficient documentation

## 2015-08-07 DIAGNOSIS — Y9289 Other specified places as the place of occurrence of the external cause: Secondary | ICD-10-CM | POA: Insufficient documentation

## 2015-08-07 DIAGNOSIS — S0191XA Laceration without foreign body of unspecified part of head, initial encounter: Secondary | ICD-10-CM

## 2015-08-07 DIAGNOSIS — S0083XA Contusion of other part of head, initial encounter: Secondary | ICD-10-CM

## 2015-08-07 DIAGNOSIS — S0181XA Laceration without foreign body of other part of head, initial encounter: Secondary | ICD-10-CM | POA: Insufficient documentation

## 2015-08-07 DIAGNOSIS — Z23 Encounter for immunization: Secondary | ICD-10-CM | POA: Insufficient documentation

## 2015-08-07 DIAGNOSIS — Z720 Tobacco use: Secondary | ICD-10-CM | POA: Insufficient documentation

## 2015-08-07 MED ORDER — TETANUS-DIPHTH-ACELL PERTUSSIS 5-2.5-18.5 LF-MCG/0.5 IM SUSP
0.5000 mL | Freq: Once | INTRAMUSCULAR | Status: AC
  Administered 2015-08-07: 0.5 mL via INTRAMUSCULAR
  Filled 2015-08-07: qty 0.5

## 2015-08-07 MED ORDER — HYDROCODONE-ACETAMINOPHEN 5-325 MG PO TABS
2.0000 | ORAL_TABLET | Freq: Once | ORAL | Status: AC
  Administered 2015-08-07: 2 via ORAL
  Filled 2015-08-07: qty 2

## 2015-08-07 NOTE — Discharge Instructions (Signed)
Fortunately our evaluation today did not identify any serious wounds.  We put 2 staples in the wound in your forehead and it is important that she have those removed in about a week.  Take over-the-counter ibuprofen and Tylenol as needed for your bruises and muscle strain.  He can use ice packs on the bruising to your face.  The burst blood vessel in your right eye will resolve slowly over time.  We also provided a tetanus vaccination since you felt like it had been more than 5 years since your last one.  Return to the emergency department if she develops new or worsening symptoms that concern you.   Facial or Scalp Contusion A facial or scalp contusion is a deep bruise on the face or head. Injuries to the face and head generally cause a lot of swelling, especially around the eyes. Contusions are the result of an injury that caused bleeding under the skin. The contusion may turn blue, purple, or yellow. Minor injuries will give you a painless contusion, but more severe contusions may stay painful and swollen for a few weeks.  CAUSES  A facial or scalp contusion is caused by a blunt injury or trauma to the face or head area.  SIGNS AND SYMPTOMS   Swelling of the injured area.   Discoloration of the injured area.   Tenderness, soreness, or pain in the injured area.  DIAGNOSIS  The diagnosis can be made by taking a medical history and doing a physical exam. An X-ray exam, CT scan, or MRI may be needed to determine if there are any associated injuries, such as broken bones (fractures). TREATMENT  Often, the best treatment for a facial or scalp contusion is applying cold compresses to the injured area. Over-the-counter medicines may also be recommended for pain control.  HOME CARE INSTRUCTIONS   Only take over-the-counter or prescription medicines as directed by your health care provider.   Apply ice to the injured area.   Put ice in a plastic bag.   Place a towel between your skin and  the bag.   Leave the ice on for 20 minutes, 2-3 times a day.  SEEK MEDICAL CARE IF:  You have bite problems.   You have pain with chewing.   You are concerned about facial defects. SEEK IMMEDIATE MEDICAL CARE IF:  You have severe pain or a headache that is not relieved by medicine.   You have unusual sleepiness, confusion, or personality changes.   You throw up (vomit).   You have a persistent nosebleed.   You have double vision or blurred vision.   You have fluid drainage from your nose or ear.   You have difficulty walking or using your arms or legs.  MAKE SURE YOU:   Understand these instructions.  Will watch your condition.  Will get help right away if you are not doing well or get worse. Document Released: 11/28/2004 Document Revised: 08/11/2013 Document Reviewed: 06/03/2013 [facility] Patient Information 2015 [place], [place]. This information is not intended to replace advice given to you by your health care provider. Make sure you discuss any questions you have with your health care provider.  Head Injury You have received a head injury. It does not appear serious at this time. Headaches and vomiting are common following head injury. It should be easy to awaken from sleeping. Sometimes it is necessary for you to stay in the emergency department for a while for observation. Sometimes admission to the hospital may be needed.  After injuries such as yours, most problems occur within the first 24 hours, but side effects may occur up to 7-10 days after the injury. It is important for you to carefully monitor your condition and contact your health care provider or seek immediate medical care if there is a change in your condition. WHAT ARE THE TYPES OF HEAD INJURIES? Head injuries can be as minor as a bump. Some head injuries can be more severe. More severe head injuries include:  A jarring injury to the brain (concussion).  A bruise of the brain (contusion). This  mean there is bleeding in the brain that can cause swelling.  A cracked skull (skull fracture).  Bleeding in the brain that collects, clots, and forms a bump (hematoma). WHAT CAUSES A HEAD INJURY? A serious head injury is most likely to happen to someone who is in a car wreck and is not wearing a seat belt. Other causes of major head injuries include bicycle or motorcycle accidents, sports injuries, and falls. HOW ARE HEAD INJURIES DIAGNOSED? A complete history of the event leading to the injury and your current symptoms will be helpful in diagnosing head injuries. Many times, pictures of the brain, such as CT or MRI are needed to see the extent of the injury. Often, an overnight hospital stay is necessary for observation.  WHEN SHOULD I SEEK IMMEDIATE MEDICAL CARE?  You should get help right away if:  You have confusion or drowsiness.  You feel sick to your stomach (nauseous) or have continued, forceful vomiting.  You have dizziness or unsteadiness that is getting worse.  You have severe, continued headaches not relieved by medicine. Only take over-the-counter or prescription medicines for pain, fever, or discomfort as directed by your health care provider.  You do not have normal function of the arms or legs or are unable to walk.  You notice changes in the black spots in the center of the colored part of your eye (pupil).  You have a clear or bloody fluid coming from your nose or ears.  You have a loss of vision. During the next 24 hours after the injury, you must stay with someone who can watch you for the warning signs. This person should contact local emergency services (911 in the [place] if you have seizures, you become unconscious, or you are unable to wake up. HOW CAN I PREVENT A HEAD INJURY IN THE FUTURE? The most important factor for preventing major head injuries is avoiding motor vehicle accidents. To minimize the potential for damage to your head, it is crucial to wear seat  belts while riding in motor vehicles. Wearing helmets while bike riding and playing collision sports (like football) is also helpful. Also, avoiding dangerous activities around the house will further help reduce your risk of head injury.  WHEN CAN I RETURN TO NORMAL ACTIVITIES AND ATHLETICS? You should be reevaluated by your health care provider before returning to these activities. If you have any of the following symptoms, you should not return to activities or contact sports until 1 week after the symptoms have stopped:  Persistent headache.  Dizziness or vertigo.  Poor attention and concentration.  Confusion.  Memory problems.  Nausea or vomiting.  Fatigue or tire easily.  Irritability.  Intolerant of bright lights or loud noises.  Anxiety or depression.  Disturbed sleep. MAKE SURE YOU:   Understand these instructions.  Will watch your condition.  Will get help right away if you are not doing well or get worse.  Document Released: 10/21/2005 Document Revised: 10/26/2013 Document Reviewed: 06/28/2013 [facility] Patient Information 2015 [place], [place]. This information is not intended to replace advice given to you by your health care provider. Make sure you discuss any questions you have with your health care provider.  Stitches, Staples, or Skin Adhesive Strips  Stitches (sutures), staples, and skin adhesive strips hold the skin together as it heals. They will usually be in place for 7 days or less. HOME CARE  Wash your hands with soap and water before and after you touch your wound.  Only take medicine as told by your doctor.  Cover your wound only if your doctor told you to. Otherwise, leave it open to air.  Do not get your stitches wet or dirty. If they get dirty, dab them gently with a clean washcloth. Wet the washcloth with soapy water. Do not rub. Pat them dry gently.  Do not put medicine or medicated cream on your stitches unless your doctor told you to.  Do  not take out your own stitches or staples. Skin adhesive strips will fall off by themselves.  Do not pick at the wound. Picking can cause an infection.  Do not miss your follow-up appointment.  If you have problems or questions, call your doctor. GET HELP RIGHT AWAY IF:   You have a temperature by mouth above 102 F (38.9 C), not controlled by medicine.  You have chills.  You have redness or pain around your stitches.  There is puffiness (swelling) around your stitches.  You notice fluid (drainage) from your stitches.  There is a bad smell coming from your wound. MAKE SURE YOU:  Understand these instructions.  Will watch your condition.  Will get help if you are not doing well or get worse. Document Released: 08/18/2009 Document Revised: 01/13/2012 Document Reviewed: 08/18/2009 [facility] Patient Information 2015 [place], [place]. This information is not intended to replace advice given to you by your health care provider. Make sure you discuss any questions you have with your health care provider.  Subconjunctival Hemorrhage A subconjunctival hemorrhage is a bright red patch covering a portion of the white of the eye. The white part of the eye is called the sclera, and it is covered by a thin membrane called the conjunctiva. This membrane is clear, except for tiny blood vessels that you can see with the naked eye. When your eye is irritated or inflamed and becomes red, it is because the vessels in the conjunctiva are swollen. Sometimes, a blood vessel in the conjunctiva can break and bleed. When this occurs, the blood builds up between the conjunctiva and the sclera, and spreads out to create a red area. The red spot may be very small at first. It may then spread to cover a larger part of the surface of the eye, or even all of the visible white part of the eye. In almost all cases, the blood will go away and the eye will become white again. Before completely dissolving, however, the  red area may spread. It may also become brownish-yellow in color before going away. If a lot of blood collects under the conjunctiva, it may look like a bulge on the surface of the eye. This looks scary, but it will also eventually flatten out and go away. Subconjunctival hemorrhages do not cause pain, but if swollen, may cause a feeling of irritation. There is no effect on vision.  CAUSES   The most common cause is mild trauma (rubbing the eye, irritation).  Subconjunctival hemorrhages can happen because of coughing or straining (lifting heavy objects), vomiting, or sneezing.  In some cases, your doctor may want to check your blood pressure. High blood pressure can also cause a subconjunctival hemorrhage.  Severe trauma or blunt injuries.  Diseases that affect blood clotting (hemophilia, leukemia).  Abnormalities of blood vessels behind the eye (carotid cavernous sinus fistula).  Tumors behind the eye.  Certain drugs (aspirin, Coumadin, heparin).  Recent eye surgery. HOME CARE INSTRUCTIONS   Do not worry about the appearance of your eye. You may continue your usual activities.  Often, follow-up is not necessary. SEEK MEDICAL CARE IF:   Your eye becomes painful.  The bleeding does not disappear within 3 weeks.  Bleeding occurs elsewhere, for example, under the skin, in the mouth, or in the other eye.  You have recurring subconjunctival hemorrhages. SEEK IMMEDIATE MEDICAL CARE IF:   Your vision changes or you have difficulty seeing.  You develop a severe headache, persistent vomiting, confusion, or abnormal drowsiness (lethargy).  Your eye seems to bulge or protrude from the eye socket.  You notice the sudden appearance of bruises or have spontaneous bleeding elsewhere on your body. Document Released: 10/21/2005 Document Revised: 03/07/2014 Document Reviewed: 09/18/2009 [facility] Patient Information 2015 [place], [place]. This information is not intended to replace advice  given to you by your health care provider. Make sure you discuss any questions you have with your health care provider.  Rib Contusion A rib contusion (bruise) can occur by a blow to the chest or by a fall against a hard object. Usually these will be much better in a couple weeks. If X-rays were taken today and there are no broken bones (fractures), the diagnosis of bruising is made. However, broken ribs may not show up for several days, or may be discovered later on a routine X-ray when signs of healing show up. If this happens to you, it does not mean that something was missed on the X-ray, but simply that it did not show up on the first X-rays. Earlier diagnosis will not usually change the treatment. HOME CARE INSTRUCTIONS   Avoid strenuous activity. Be careful during activities and avoid bumping the injured ribs. Activities that pull on the injured ribs and cause pain should be avoided, if possible.  For the first day or two, an ice pack used every 20 minutes while awake may be helpful. Put ice in a plastic bag and put a towel between the bag and the skin.  Eat a normal, well-balanced diet. Drink plenty of fluids to avoid constipation.  Take deep breaths several times a day to keep lungs free of infection. Try to cough several times a day. Splint the injured area with a pillow while coughing to ease pain. Coughing can help prevent pneumonia.  Wear a rib belt or binder only if told to do so by your caregiver. If you are wearing a rib belt or binder, you must do the breathing exercises as directed by your caregiver. If not used properly, rib belts or binders restrict breathing which can lead to pneumonia.  Only take over-the-counter or prescription medicines for pain, discomfort, or fever as directed by your caregiver. SEEK MEDICAL CARE IF:   You or your child has an oral temperature above 102 F (38.9 C).  Your baby is older than 3 months with a rectal temperature of 100.5 F (38.1 C) or  higher for more than 1 day.  You develop a cough, with thick or bloody sputum.  SEEK IMMEDIATE MEDICAL CARE IF:   You have difficulty breathing.  You feel sick to your stomach (nausea), have vomiting or belly (abdominal) pain.  You have worsening pain, not controlled with medications, or there is a change in the location of the pain.  You develop sweating or radiation of the pain into the arms, jaw or shoulders, or become light headed or faint.  You or your child has an oral temperature above 102 F (38.9 C), not controlled by medicine.  Your or your baby is older than 3 months with a rectal temperature of 102 F (38.9 C) or higher.  Your baby is 61 months old or younger with a rectal temperature of 100.4 F (38 C) or higher. MAKE SURE YOU:   Understand these instructions.  Will watch your condition.  Will get help right away if you are not doing well or get worse. Document Released: 07/16/2001 Document Revised: 02/15/2013 Document Reviewed: 06/08/2008 [facility] Patient Information 2015 [place], [place]. This information is not intended to replace advice given to you by your health care provider. Make sure you discuss any questions you have with your health care provider.
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
## 2015-08-07 NOTE — ED Notes (Signed)
Pt discharged home after verbalizing understanding of discharge instructions; nad noted. 
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
## 2015-08-19 ENCOUNTER — Encounter (HOSPITAL_COMMUNITY): Payer: Self-pay

## 2015-08-19 ENCOUNTER — Emergency Department (HOSPITAL_COMMUNITY)
Admission: EM | Admit: 2015-08-19 | Discharge: 2015-08-19 | Disposition: A | Discharge status: Home / Self Care | Payer: Self-pay | Attending: Emergency Medicine | Admitting: Emergency Medicine

## 2015-08-19 DIAGNOSIS — J45909 Unspecified asthma, uncomplicated: Secondary | ICD-10-CM | POA: Insufficient documentation

## 2015-08-19 DIAGNOSIS — Z4802 Encounter for removal of sutures: Secondary | ICD-10-CM | POA: Insufficient documentation

## 2015-08-19 DIAGNOSIS — Z79899 Other long term (current) drug therapy: Secondary | ICD-10-CM | POA: Insufficient documentation

## 2015-08-19 DIAGNOSIS — Z720 Tobacco use: Secondary | ICD-10-CM | POA: Insufficient documentation

## 2015-08-19 NOTE — Discharge Instructions (Signed)

## 2015-08-19 NOTE — ED Notes (Signed)
He is here to have staples removed from mid forehead ~1 cm into hairline.  He is in no distress.
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
## 2015-10-21 IMAGING — CR DG CHEST 2V
2 series · 2 of 2 positions shown · non-contrast
Comparison: 04/27/2011

CLINICAL DATA: Shortness of breath since 0400 hrs

EXAM:
CHEST  2 VIEW

[w chest pa]
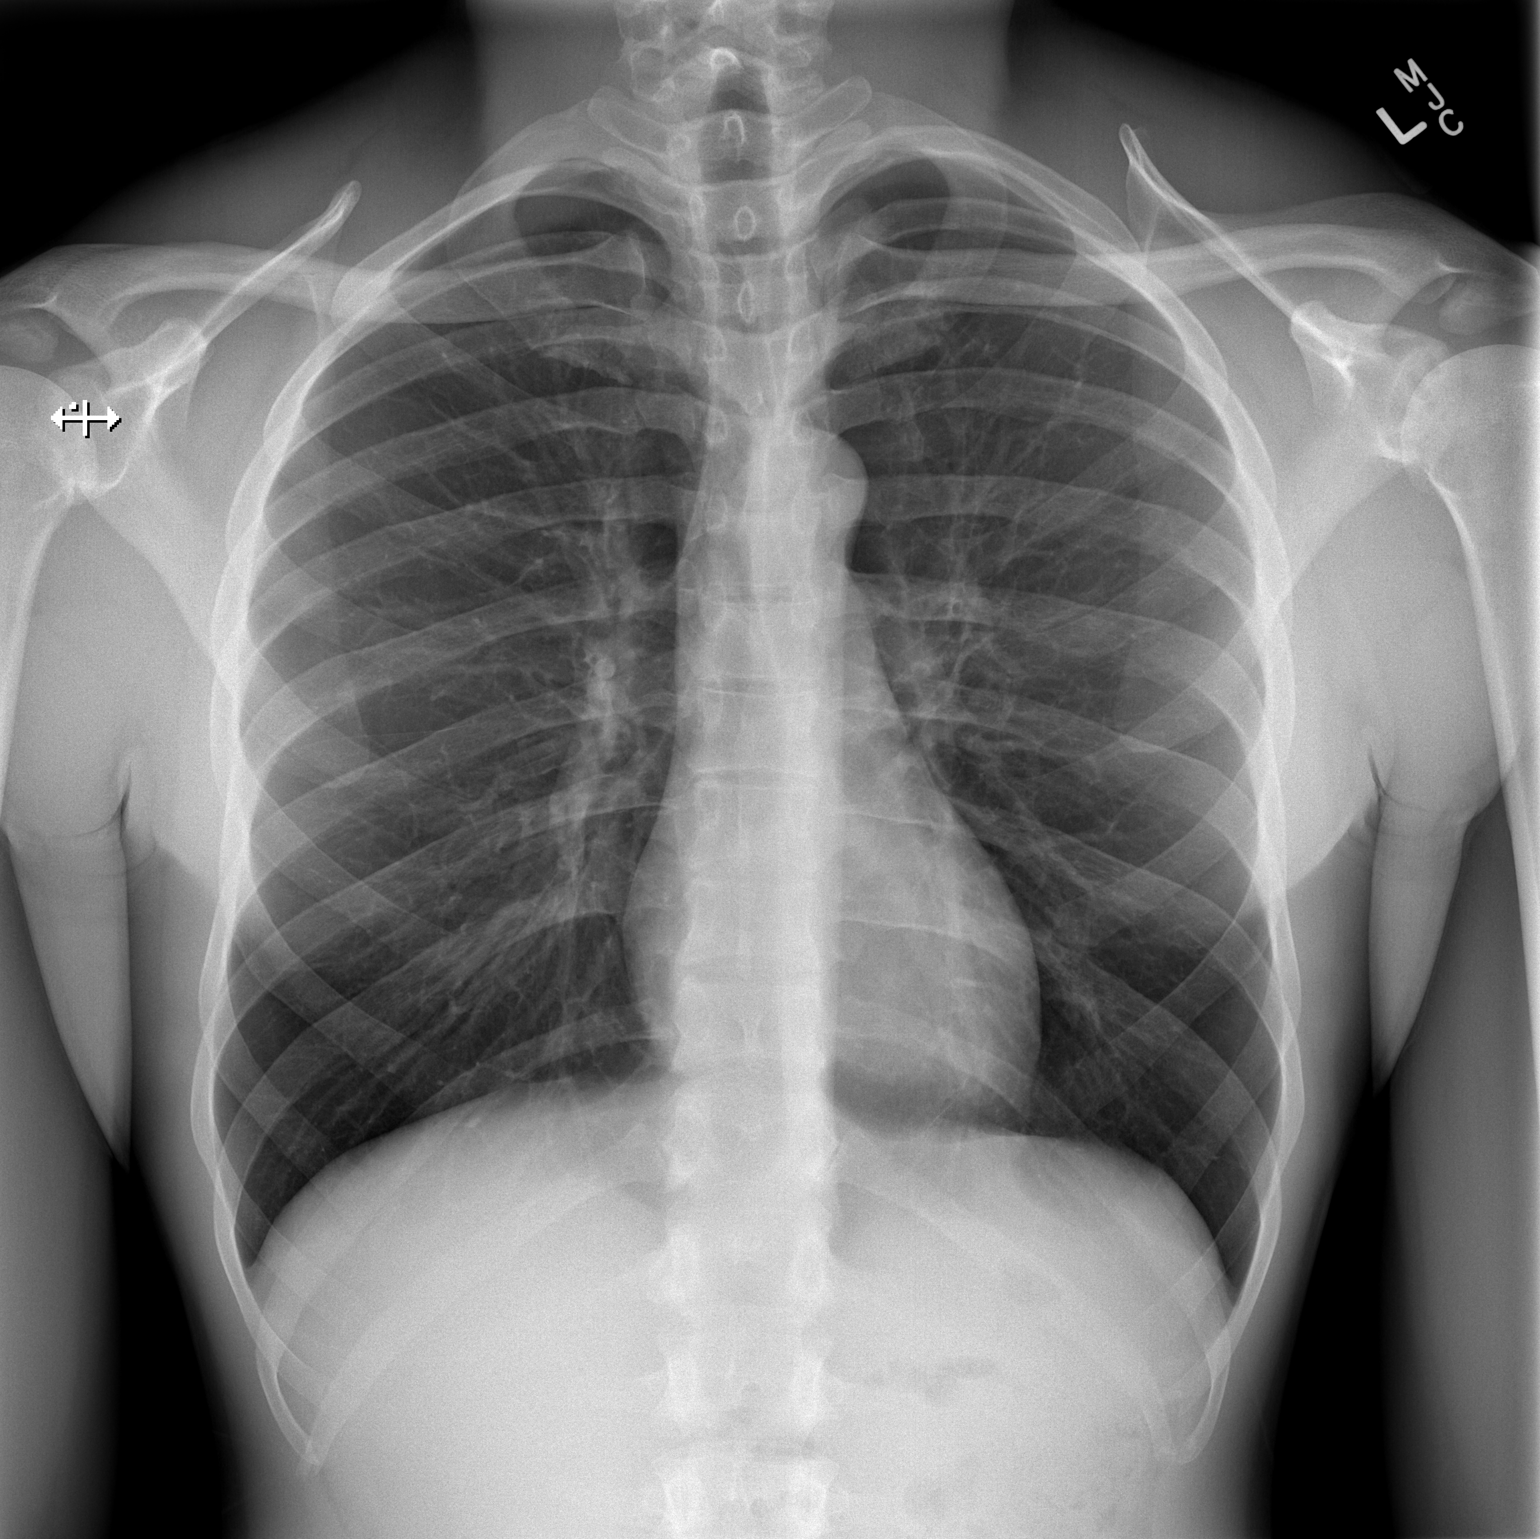

[w chest lat]
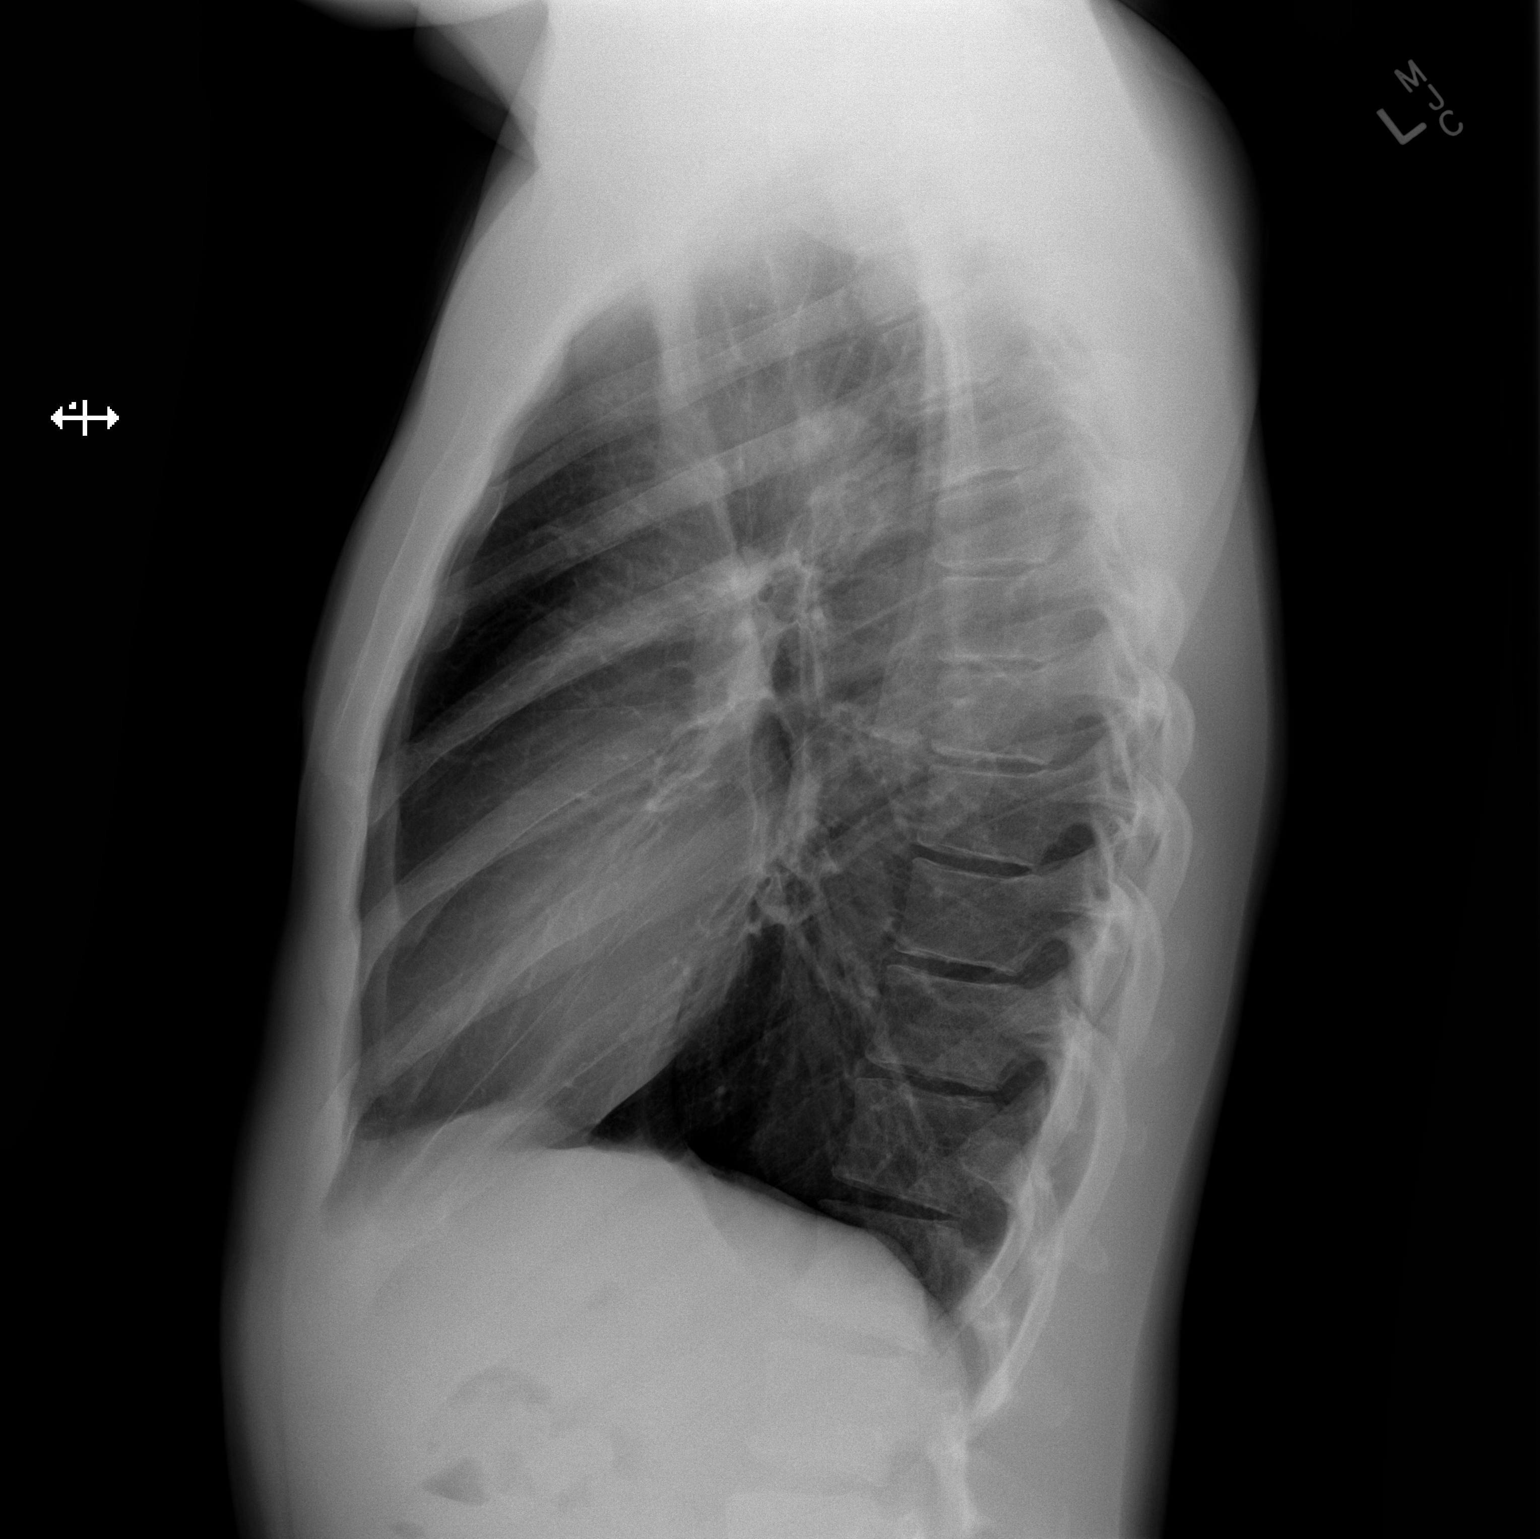

[2 of 2 positions shown; findings below may reference images not displayed]

FINDINGS: The heart size and mediastinal contours are within normal limits.
Both lungs are clear. The visualized skeletal structures are
unremarkable.
IMPRESSION: No active cardiopulmonary disease.
